# Patient Record
Sex: Female | Born: 1950 | Race: White | Hispanic: No | Marital: Married | Smoking: Never smoker
Health system: Southern US, Community
[De-identification: ages and names within clinical notes are randomized; demographics above are authoritative.]

## PROBLEM LIST (undated history)

## (undated) ENCOUNTER — Ambulatory Visit (HOSPITAL_COMMUNITY): Admission: EM | Payer: Medicare Other | Source: Home / Self Care

## (undated) DIAGNOSIS — R112 Nausea with vomiting, unspecified: Secondary | ICD-10-CM

## (undated) DIAGNOSIS — E079 Disorder of thyroid, unspecified: Secondary | ICD-10-CM

## (undated) DIAGNOSIS — R002 Palpitations: Secondary | ICD-10-CM

## (undated) DIAGNOSIS — F419 Anxiety disorder, unspecified: Secondary | ICD-10-CM

## (undated) DIAGNOSIS — Z9889 Other specified postprocedural states: Secondary | ICD-10-CM

## (undated) HISTORY — DX: Disorder of thyroid, unspecified: E07.9

## (undated) HISTORY — DX: Palpitations: R00.2

## (undated) HISTORY — PX: BACK SURGERY: SHX140

---

## 1986-10-09 HISTORY — PX: BACK SURGERY: SHX140

## 1989-10-09 HISTORY — PX: ABDOMINAL HYSTERECTOMY: SHX81

## 1998-12-17 ENCOUNTER — Emergency Department (HOSPITAL_COMMUNITY): Admission: EM | Admit: 1998-12-17 | Discharge: 1998-12-17 | Payer: Self-pay | Admitting: Emergency Medicine

## 1998-12-17 ENCOUNTER — Encounter: Payer: Self-pay | Admitting: Emergency Medicine

## 1999-07-20 ENCOUNTER — Encounter: Admission: RE | Admit: 1999-07-20 | Discharge: 1999-07-20 | Payer: Self-pay | Admitting: Family Medicine

## 2000-04-19 ENCOUNTER — Encounter: Admission: RE | Admit: 2000-04-19 | Discharge: 2000-04-19 | Payer: Self-pay | Admitting: Family Medicine

## 2000-04-19 ENCOUNTER — Encounter: Payer: Self-pay | Admitting: Family Medicine

## 2000-08-01 ENCOUNTER — Observation Stay (HOSPITAL_COMMUNITY): Admission: RE | Admit: 2000-08-01 | Discharge: 2000-08-02 | Payer: Self-pay | Admitting: Obstetrics and Gynecology

## 2000-08-01 ENCOUNTER — Encounter (INDEPENDENT_AMBULATORY_CARE_PROVIDER_SITE_OTHER): Payer: Self-pay | Admitting: Specialist

## 2001-03-20 ENCOUNTER — Encounter: Payer: Self-pay | Admitting: Family Medicine

## 2001-03-20 ENCOUNTER — Encounter: Admission: RE | Admit: 2001-03-20 | Discharge: 2001-03-20 | Payer: Self-pay | Admitting: Family Medicine

## 2001-07-03 ENCOUNTER — Ambulatory Visit (HOSPITAL_COMMUNITY): Admission: RE | Admit: 2001-07-03 | Discharge: 2001-07-03 | Payer: Self-pay | Admitting: Gastroenterology

## 2002-04-29 ENCOUNTER — Other Ambulatory Visit: Admission: RE | Admit: 2002-04-29 | Discharge: 2002-04-29 | Payer: Self-pay | Admitting: Obstetrics and Gynecology

## 2003-05-07 ENCOUNTER — Other Ambulatory Visit: Admission: RE | Admit: 2003-05-07 | Discharge: 2003-05-07 | Payer: Self-pay | Admitting: Obstetrics and Gynecology

## 2003-07-14 ENCOUNTER — Other Ambulatory Visit: Admission: RE | Admit: 2003-07-14 | Discharge: 2003-07-14 | Payer: Self-pay | Admitting: Diagnostic Radiology

## 2005-08-10 ENCOUNTER — Encounter: Admission: RE | Admit: 2005-08-10 | Discharge: 2005-08-10 | Payer: Self-pay | Admitting: Family Medicine

## 2006-08-13 ENCOUNTER — Encounter: Admission: RE | Admit: 2006-08-13 | Discharge: 2006-08-13 | Payer: Self-pay | Admitting: Family Medicine

## 2007-09-20 ENCOUNTER — Encounter: Admission: RE | Admit: 2007-09-20 | Discharge: 2007-09-20 | Payer: Self-pay | Admitting: Family Medicine

## 2007-10-11 ENCOUNTER — Encounter: Admission: RE | Admit: 2007-10-11 | Discharge: 2007-10-11 | Payer: Self-pay | Admitting: Surgery

## 2007-10-11 ENCOUNTER — Encounter: Admission: RE | Admit: 2007-10-11 | Discharge: 2007-10-11 | Payer: Self-pay | Admitting: Oral Surgery

## 2007-11-10 HISTORY — PX: NASAL SINUS SURGERY: SHX719

## 2008-09-24 ENCOUNTER — Encounter: Admission: RE | Admit: 2008-09-24 | Discharge: 2008-09-24 | Payer: Self-pay | Admitting: Family Medicine

## 2009-05-09 HISTORY — PX: COLON SURGERY: SHX602

## 2009-09-30 ENCOUNTER — Encounter: Admission: RE | Admit: 2009-09-30 | Discharge: 2009-09-30 | Payer: Self-pay | Admitting: Family Medicine

## 2010-03-22 ENCOUNTER — Other Ambulatory Visit: Admission: RE | Admit: 2010-03-22 | Discharge: 2010-03-22 | Payer: Self-pay | Admitting: Diagnostic Radiology

## 2010-03-22 ENCOUNTER — Encounter: Admission: RE | Admit: 2010-03-22 | Discharge: 2010-03-22 | Payer: Self-pay | Admitting: Surgery

## 2010-10-04 ENCOUNTER — Encounter
Admission: RE | Admit: 2010-10-04 | Discharge: 2010-10-04 | Payer: Self-pay | Source: Home / Self Care | Attending: Family Medicine | Admitting: Family Medicine

## 2010-10-29 ENCOUNTER — Encounter: Payer: Self-pay | Admitting: Family Medicine

## 2011-02-22 ENCOUNTER — Encounter (INDEPENDENT_AMBULATORY_CARE_PROVIDER_SITE_OTHER): Payer: Self-pay | Admitting: Surgery

## 2011-02-24 NOTE — Discharge Summary (Signed)
[facility]  Patient:    Julia French, Julia French                          MRN: [id number] Adm. Date:  [id number] Disc. Date: [id number] Attending:  Cordelia Pen Ii                           Discharge Summary  HISTORY OF PRESENT ILLNESS:  The patient is a 60 year old married white female, G1, P1, with enlarging leiomyomata.  HOSPITAL COURSE:  The patient is admitted to the hospital and undergoes laparoscopically assisted vaginal hysterectomy with bilateral salpingo-oophorectomy on August 01, 2000.  This is without complication. Estimated blood loss was 350 cc.  On the evening of surgery she has good pain relief.  She had some nausea and vomiting which had resolved and is not passing flatus.  She remains afebrile with stable vital signs and good urine output.  On the day of discharge she is ambulating well, voiding well, and passing flatus.  She remains afebrile.  Hemoglobin is 10.3, white count 7.2, and platelet count 263,000.  Pathology is pending.  CONDITION ON DISCHARGE:  Good.  DIET:  Regular as tolerated.  ACTIVITY:  No lifting, no operation of automobiles, no vaginal entry.  DISCHARGE INSTRUCTIONS:  She is to call the office with problems including but not limited to temperature over 100 degrees, increasing pain, heavy vaginal bleeding, or persistent nausea and vomiting.  MEDICATIONS: 1. Tylox #30 1-2 p.o. q.6h. p.r.n. 2. Premarin 0.625 mg 1 p.o. q.d.  FOLLOW-UP:  In the office in two weeks. DD:  08/02/00 TD:  08/03/00 Job: [id number]

## 2011-02-24 NOTE — Procedures (Signed)
[place]  Patient:    Julia French, Julia French Visit Number: [id number] MRN: [id number]          Service Type: END Location: ENDO Attending Physician:  Charna Elizabeth Dictated by:   Anselmo Rod, M.D. Proc. Date: 07/03/01 Admit Date:  07/03/2001   CC:         Teena Irani. Arlyce Dice, M.D.   Procedure Report  DATE OF BIRTH:  December 29, 1951  REFERRING PHYSICIAN:  Teena Irani. Arlyce Dice, M.D.  PROCEDURE PERFORMED:  Screening colonoscopy.  ENDOSCOPIST:  Anselmo Rod, M.D.  INSTRUMENT USED:  Olympus pediatric video colonoscope.  INDICATIONS FOR PROCEDURE:  Family history of colon cancer in a 60 year old female.  Rule out colonic polyps, masses, hemorrhoids, etc.  PREPROCEDURE PREPARATION:  Informed consent was procured from the patient. The patient was fasted for eight hours prior to the procedure and prepped with a bottle of magnesium citrate and a gallon of NuLytely the night prior to the procedure.  PREPROCEDURE PHYSICAL:  The patient had stable vital signs.  Neck supple. Chest clear to auscultation.  S1, S2 regular.  Abdomen soft with normal abdominal bowel sounds.  DESCRIPTION OF PROCEDURE:  The patient was placed in the left lateral decubitus position and sedated with 50 mg of Demerol and 7 mg of Versed intravenously.  Once the patient was adequately sedated and maintained on low-flow oxygen and continuous cardiac monitoring, the Olympus video colonoscope was advanced from the rectum to the cecum.  Except for a few scattered diverticula, no masses, polyps, erosions, etc. were seen.  IMPRESSION:  Healthy-appearing colon up to the cecum except for a few scattered diverticula.  RECOMMENDATIONS: 1. Repeat colorectal screening is recommended in the next five years unless    the patient were to develop any abnormal symptoms in the interim. 2. A high fiber diet has been discussed and brochures have been given    to her for her education. 3. Outpatient  follow-up is advised on a p.r.n. basis.  Dictated by:   Anselmo Rod, M.D. Attending Physician:  Charna Elizabeth DD:  07/03/01 TD:  07/03/01 Job: [id number]

## 2011-02-24 NOTE — Op Note (Signed)
[facility]  Patient:    Julia French, Julia French                          MRN: [id number] Proc. Date: 08/01/00 Adm. Date:  [id number] Attending:  Cordelia Pen Ii                           Operative Report  PREOPERATIVE DIAGNOSIS:  Uterine leiomyomata.  POSTOPERATIVE DIAGNOSIS:  Uterine leiomyomata.  PROCEDURE:  Laparoscopically assisted vaginal hysterectomy with bilateral salpingo-oophorectomy.  SURGEON:  Guy Sandifer. Arleta Creek, M.D.  ASSISTANT:  Trevor Iha, M.D.  ANESTHESIA:  General with endotracheal intubation.  ESTIMATED BLOOD LOSS:  350 cc.  INDICATIONS AND CONSENT:  This patient is a 60 year old married white female, G1, P1, with enlarging leiomyomata.  Details are dictated in the history and physical.  Laparoscopically assisted vaginal hysterectomy with bilateral salpingo-oophorectomy is discussed with the patient and her husband.  The possible risks andcomplications have been discussed, including but not limited to infection, bowel, bladder, and ureteral damage, bleeding requiring transfusion ofblood products with possible transfusion reaction and HIV or hepatitis acquisition, DVT or PE, pneumonia, laparotomy, and fistula formation.  Allquestions are answered, and consent is signed on the chart.  FINDINGS:  Upper abdomen is normal.  There are some filmy adhesions to the anterior abdominal wall to right pelvic brim, which do not form closed loops. The uterus is enlarged to approximately an eight to ten weeks size with intramural leiomyomata.  These are primarily found on the posterior fundus. Tubes and ovaries normal bilaterally.  There are some filmy adhesions around the left infundibulopelvic ligament.  DESCRIPTION OF PROCEDURE:  The patient was taken to the operating room and placed in the dorsal supine position, where general anesthesia is induced via endotracheal intubation.  She is then placed in the dorsal lithotomy  position, where she is prepped abdominally and vaginally, bladder is straight-catheterized, Hulka tenaculum is placed in the uterus as a manipulator, and she is drapedin the sterile fashion.  A small infraumbilical incision is made, and a 12 mm disposable trocar sleeve was placed without difficulty.  Placement is verified with the laparoscope, and no damage to surrounding structuresis noted.  Pneumoperitoneum is induced.  Small suprapubic and later leftlower quadrant incisions are made after careful transillumination, and 5mm nondisposable trocar sleeves are placed under direct visualization without difficulty.  After noting the above findings, the adhesions around the left infundibulopelvic ligament are taken down sharply without difficulty.  Minor bipolar cautery is used to obtain hemostasis.  The course ofthe ureters is identified bilaterally.  Then using the 5 mm laparoscope through the left lower trocar sleeve, the infundibulopelvic ligament followed by the round ligaments are taken down with the disposable stapler with the white cartridge bilaterally.  This uses a total of three bites on the right and two bites on the left.  Reinspection with the operative laparoscope reveals good hemostasis of the staple line.  There is some bleeding in the mesosalpinx on the left tube where it had torn during traction with the grasper.  Bipolar cautery is used to slow this down on the left fallopian tube.  Instruments are removed, pneumoperitoneum is reduced, and attention is turned to the vagina.  Weighted speculum is placed, and the posterior cul-de-sac is entered sharply with the scissors without difficulty. The cervix is circumscribed with a scalpel.  The vaginal introitus is fairly narrow, greatly reducing  mobility.  In addition, there is essentially little to no prolapse of the uterus still at this point.  The uterosacral ligaments are taken down bilaterally and ligated with transfixion  sutures of 0 Monocryl. All suture will be 0 Monocryl unless otherwise designated.  Progressive bites of the bladder pillars and the cardinal ligaments are then taken bilaterally. Dissection anteriorly is carried out sharply and bluntly anteriorly.  The anterior cul-de-sac is then entered without difficulty.  After ligating the uterine vessels and taking two bites above this level bilaterally, the fundus is delivered posteriorly with the adnexa.  Proximal ligaments are clamped and cut, and the specimen is delivered.  The left ligament is ligated first with a free tie, then with asuture.  The right pedicle is ligated with a free tie. Good hemostasis is noted.  The uterosacral ligaments are then plicated to the vagina bilaterally.  The uterosacral ligaments are then plicated in the midline withone suture.  The cuff is closed posteriorly, then anteriorly, with figure-of-eight sutures.  A Foley catheter is placed, and clear urine is noted.  Reinspection with the laparoscope reveals minor bleeding at peritoneal edges on the vaginal cuff.  These are controlled with bipolar cautery. Irrigation is carried out, and excess fluid is removed.  The inferior trocar sleeves are removed, pneumoperitoneum is reduced, and no bleeding is noted from any site.  The umbilical trocar sleeve is removed.  The umbilical incision is closed first with a 0 Vicryl in the deeper underlying tissues with care being taken not to pick up any underlying structures.  The skinincision is then closed with subcuticular 3-0 Vicryl suture.  The skin is injected with 0.5% plain Marcaine.  Dressings are applied.  All counts are correct.  The patient is awakened and taken to the recovery room in stable condition. DD:  08/01/00 TD:  08/01/00 Job: [id number]

## 2011-02-24 NOTE — H&P (Signed)
[facility]  Patient:    Julia French, Julia French                          MRN: [id number] Adm. Date:  08/01/00 Attending:  Guy Sandifer. Arleta Creek, M.D.                         History and Physical  CHIEF COMPLAINT:  Uterine leiomyomata.  HISTORY OF PRESENT ILLNESS:  The patient is a 60 year old married white female gravida 1, para 1, who has been on Premphase for several months.  On a recent examination, she was felt to have an enlarged uterus which was a new finding. She was then referred to me.  On examination, the uterus was 8-10 weeks in size and irregular in contour.  Pelvic ultrasound on April 19, 2000, revealed multiple uterine leiomyomata with largest being 3.5 cm.  The right ovary appeared normal and the left ovary could not be visualized.  The patient complains of abdominal pain and bloating and pelvic pressure for the past 2-3 months.  She also has bumper dyspareunia.  After a careful discussion of options of management, the patient is being admitted for a laparoscopically assisted vaginal hysterectomy with bilateral salpingo-oophorectomy.  PAST MEDICAL HISTORY: 1. Hypothyroidism. 2. History of a bulging lumbar disk in 1985.  PAST SURGICAL HISTORY:  None.  OBSTETRIC HISTORY:  Vaginal delivery in 1982.  MEDICATIONS:  Synthroid 0.1 mg daily, Premphase which was discontinued on July 18, 2000, vitamin E, and vitamin C daily.  ALLERGIES:  NO KNOWN DRUG ALLERGIES.  FAMILY HISTORY:  Positive for twins in mother, type 2 diabetes in mother, and stomach cancer in brother.  SOCIAL HISTORY:  The patient denies tobacco, alcohol, or drug abuse.  REVIEW OF SYSTEMS:  Negative except as above.  PHYSICAL EXAMINATION:  VITAL SIGNS:  Height 5 feet and 2 inches, weight 134.5 pounds, and blood pressure 114/64.  HEENT:  Without thyromegaly.  LUNGS:  Clear to auscultation.  HEART:  Regular rate and rhythm.  ABDOMEN:  Soft and nontender without  masses.  BREASTS:  Not examined.  PELVIC EXAMINATION:  Both vagina and cervix without lesion.  Uterus is irregular is shape, 8-10 weeks in size, mobile, and nontender.  Adnexae are nontender without masses.  Rectovaginal exam without masses.  EXTREMITIES:  Grossly within normal limits.  NEUROLOGICAL EXAMINATION:  Grossly within normal limits.  LABORATORY DATA:  Pap smear is benign 3-4 months ago.  ASSESSMENT:  Uterine leiomyomata.  PLAN:  Laparoscopically assisted vaginal hysterectomy with bilateral salpingo-oophorectomy. DD:  07/19/00 TD:  07/19/00 Job: [id number]

## 2012-01-25 ENCOUNTER — Other Ambulatory Visit (INDEPENDENT_AMBULATORY_CARE_PROVIDER_SITE_OTHER): Payer: Self-pay | Admitting: Surgery

## 2012-01-25 ENCOUNTER — Telehealth (INDEPENDENT_AMBULATORY_CARE_PROVIDER_SITE_OTHER): Payer: Self-pay

## 2012-01-25 DIAGNOSIS — E041 Nontoxic single thyroid nodule: Secondary | ICD-10-CM

## 2012-01-25 NOTE — Telephone Encounter (Signed)
Thyroid ultrasound at [facility]- [address] location Ph [phone].  Information and lab slip mailed on 01/25/2012.

## 2012-01-30 ENCOUNTER — Other Ambulatory Visit: Payer: Self-pay

## 2012-01-30 ENCOUNTER — Other Ambulatory Visit (INDEPENDENT_AMBULATORY_CARE_PROVIDER_SITE_OTHER): Payer: Self-pay | Admitting: Surgery

## 2012-01-30 ENCOUNTER — Ambulatory Visit
Admission: RE | Admit: 2012-01-30 | Discharge: 2012-01-30 | Disposition: A | Discharge status: Home / Self Care | Payer: BC Managed Care – PPO | Source: Ambulatory Visit | Attending: Surgery | Admitting: Surgery

## 2012-01-30 DIAGNOSIS — E041 Nontoxic single thyroid nodule: Secondary | ICD-10-CM

## 2012-02-05 ENCOUNTER — Telehealth (INDEPENDENT_AMBULATORY_CARE_PROVIDER_SITE_OTHER): Payer: Self-pay | Admitting: General Surgery

## 2012-02-05 NOTE — Telephone Encounter (Signed)
Pt of Dr. Gerrit Friends had TSH done recently, calling for refill of Synthroid (Levothyroxine.)  Currently takes 88 mcg QD.  Please call to [facility] at [phone].

## 2012-02-05 NOTE — Telephone Encounter (Signed)
error 

## 2012-02-05 NOTE — Telephone Encounter (Signed)
Per VO Dr. Carolynne Edouard, called in refill to [facility]:   Levothyroxine 88 mcg; #30, 1 po QD, refill x2 (or may dispense #90 with 0 refill.)  Message left for pt to pick-up.

## 2012-02-28 ENCOUNTER — Telehealth (INDEPENDENT_AMBULATORY_CARE_PROVIDER_SITE_OTHER): Payer: Self-pay | Admitting: Surgery

## 2012-02-28 ENCOUNTER — Ambulatory Visit (INDEPENDENT_AMBULATORY_CARE_PROVIDER_SITE_OTHER): Payer: BC Managed Care – PPO | Admitting: Surgery

## 2012-02-28 ENCOUNTER — Encounter (HOSPITAL_COMMUNITY): Payer: Self-pay | Admitting: *Deleted

## 2012-02-28 ENCOUNTER — Encounter (INDEPENDENT_AMBULATORY_CARE_PROVIDER_SITE_OTHER): Payer: Self-pay | Admitting: Surgery

## 2012-02-28 VITALS — BP 134/86 | HR 74 | Temp 98.2°F | Resp 14 | Ht 63.0 in | Wt 150.1 lb

## 2012-02-28 DIAGNOSIS — E042 Nontoxic multinodular goiter: Secondary | ICD-10-CM | POA: Insufficient documentation

## 2012-02-28 NOTE — Progress Notes (Signed)
Chief Complaint  Patient presents with  . Thyroid Nodule    Pain, lump on side of neck - thyroid recheck    HISTORY: Patient is a 60-year-old female followed in my surgical practice for multinodular thyroid goiter. Patient has a known dominant mass in the left thyroid lobe. She underwent a followup ultrasound scanning on January 30, 2012. This showed interval enlargement of the dominant nodule in the left lobe which now measures 5.8 cm. Previous fine-needle aspiration biopsy had shown benign findings. However there are some calcifications present. Patient has noted some pain in the left side of the neck on occasion. This lasts for a few minutes. She denies any dysphagia. She denies any dyspnea. Recent laboratory studies include a TSH level which was normal at 1.369. Patient continues on Synthroid 100 mcg daily.  Past Medical History  Diagnosis Date  . Thyroid disease   . Palpitations      Current Outpatient Prescriptions  Medication Sig Dispense Refill  . levothyroxine (SYNTHROID, LEVOTHROID) 100 MCG tablet Take 100 mcg by mouth daily.        . omega-3 acid ethyl esters (LOVAZA) 1 G capsule Take 2 g by mouth daily. 4 capsules daily          No Known Allergies   Family History  Problem Relation Age of Onset  . Cancer Brother     Stomach     History   Social History  . Marital Status: Married    Spouse Name: N/A    Number of Children: N/A  . Years of Education: N/A   Social History Main Topics  . Smoking status: Never Smoker   . Smokeless tobacco: None  . Alcohol Use: Yes     1 glass of red wine 3 times a week  . Drug Use: None  . Sexually Active: None   Other Topics Concern  . None   Social History Narrative  . None     REVIEW OF SYSTEMS - PERTINENT POSITIVES ONLY: Intermittent left neck pain, denies dysphagia, denies dyspnea, denies tremor, denies palpitations  EXAM: Filed Vitals:   02/28/12 1022  BP: 134/86  Pulse: 74  Temp: 98.2 F (36.8 C)  Resp: 14     HEENT: normocephalic; pupils equal and reactive; sclerae clear; dentition good; mucous membranes moist NECK:  Dominant mass occupying the entire left thyroid lobe and extending beneath the clavicle, firm, mobile with swallowing, nontender; right lobe without palpable mass or nodule; asymmetric on extension; no palpable anterior or posterior cervical lymphadenopathy; no supraclavicular masses; no tenderness CHEST: clear to auscultation bilaterally without rales, rhonchi, or wheezes CARDIAC: regular rate and rhythm without significant murmur; peripheral pulses are full EXT:  non-tender without edema; no deformity NEURO: no gross focal deficits; no sign of tremor   LABORATORY RESULTS: See [facility] ([facility]) for most recent results   RADIOLOGY RESULTS: See [facility] (CHL-Epic) for most recent results   IMPRESSION: #1 dominant left thyroid nodule, interval enlargement, 5.8 cm, with mild compressive symptoms #2 multinodular thyroid goiter #3 hypothyroidism  PLAN: I discussed the above issues and findings at length with the patient and her husband. I have recommended total thyroidectomy for management of an enlarging left thyroid mass. I believe the risk of malignancy is approximately 10-20%. I believe she is beginning to develop compressive symptoms. We discussed the procedure, the hospital stay to be anticipated, potential complications, and the need for lifelong thyroid hormone replacement. They understand and wish to proceed.  The risks and benefits   of the procedure have been discussed at length with the patient.  The patient understands the proposed procedure, potential alternative treatments, and the course of recovery to be expected.  All of the patient's questions have been answered at this time.  The patient wishes to proceed with surgery.  Yarnell Arvidson M. Liller Yohn, MD, FACS General & Endocrine Surgery [facility]   Visit Diagnoses: 1. Multinodular  goiter (nontoxic)     Primary Care Physician: No primary provider on file.   

## 2012-02-28 NOTE — Patient Instructions (Signed)
[facility] [phone]  THYROID & PARATHYROID SURGERY -- POST OP INSTRUCTIONS  Always review your discharge instruction sheet from the facility where your surgery was performed.  1. A prescription for pain medication may be given to you upon discharge.  Take your pain medication as prescribed, if needed.  If narcotic pain medicine is not needed, then you may take acetaminophen (Tylenol) or ibuprofen (Advil) as needed. 2. Take your usually prescribed medications unless otherwise directed. 3. If you need a refill on your pain medication, please contact your pharmacy. They will contact our office to request authorization.  Prescriptions will not be processed after 5 pm or on weekends. 4. Start with a light diet upon arrival home, such as soup and crackers, etc.  Be sure to drink pleny of fluids daily.  Resume your normal diet the day after surgery. 5. Most patients will experience some swelling and bruising on the chest and neck area.  Ice packs will help.  Swelling and bruising can take several days to resolve.  6. It is common to experience some constipation if taking pain medication after surgery.  Increasing fluid intake and taking a stool softener will usually help or prevent this problem.  A mild laxative (Milk of Magnesia or Miralax) should be taken according to package directions if there are no bowel movements after 48 hours. 7. You may remove your bandages 24-48 hours after surgery, and you may shower at that time.  You have steri-strips (small skin tapes) in place directly over the incision.  These strips should be left on the skin for 7-10 days and then removed. 8. You may resume regular (light) daily activities beginning the next day-such as daily self-care, walking, climbing stairs-gradually increasing activities as tolerated.  You may have sexual intercourse when it is comfortable.  Refrain from any heavy lifting or straining until approved by your doctor.  You may drive  when you no longer are taking prescription pain medication, you can comfortably wear a seatbelt, and you can safely maneuver your car and apply brakes. 9. You should see your doctor in the office for a follow-up appointment approximately two weeks after your surgery.  Make sure that you call for this appointment within a day or two after you arrive home to insure a convenient appointment time.  WHEN TO CALL YOUR DOCTOR: 1. Fever over 101.5 2. Inability to urinate 3. Nausea and/or vomiting - persistent 4. Extreme swelling or bruising 5. Continued bleeding from incision 6. Increased pain, redness, or drainage from the incision 7. Difficulty swallowing or breathing 8. Muscle cramping or spasms 9. Numbness or tingling in hands or feet or around lips  The clinic staff is available to answer your questions during regular business hours.  Please don't hesitate to call and ask to speak to one of the nurses if you have concerns.  [email or web address]   

## 2012-02-29 ENCOUNTER — Other Ambulatory Visit (INDEPENDENT_AMBULATORY_CARE_PROVIDER_SITE_OTHER): Payer: Self-pay

## 2012-02-29 ENCOUNTER — Telehealth (INDEPENDENT_AMBULATORY_CARE_PROVIDER_SITE_OTHER): Payer: Self-pay

## 2012-02-29 NOTE — Telephone Encounter (Signed)
PO appt card and lab slip for PO calcium mailed to pt.

## 2012-03-05 ENCOUNTER — Ambulatory Visit (HOSPITAL_COMMUNITY): Payer: BC Managed Care – PPO

## 2012-03-05 ENCOUNTER — Encounter (HOSPITAL_COMMUNITY)
Admission: RE | Disposition: A | Discharge status: Home / Self Care | Payer: Self-pay | Source: Ambulatory Visit | Attending: Surgery

## 2012-03-05 ENCOUNTER — Encounter (HOSPITAL_COMMUNITY): Payer: Self-pay | Admitting: *Deleted

## 2012-03-05 ENCOUNTER — Ambulatory Visit (HOSPITAL_COMMUNITY)
Admission: RE | Admit: 2012-03-05 | Discharge: 2012-03-07 | Disposition: A | Discharge status: Home / Self Care | Payer: BC Managed Care – PPO | Source: Ambulatory Visit | Attending: Surgery | Admitting: Surgery

## 2012-03-05 ENCOUNTER — Encounter (HOSPITAL_COMMUNITY): Payer: Self-pay

## 2012-03-05 ENCOUNTER — Ambulatory Visit (HOSPITAL_COMMUNITY): Payer: BC Managed Care – PPO | Admitting: *Deleted

## 2012-03-05 DIAGNOSIS — R42 Dizziness and giddiness: Secondary | ICD-10-CM | POA: Diagnosis not present

## 2012-03-05 DIAGNOSIS — Z01810 Encounter for preprocedural cardiovascular examination: Secondary | ICD-10-CM | POA: Insufficient documentation

## 2012-03-05 DIAGNOSIS — I517 Cardiomegaly: Secondary | ICD-10-CM | POA: Diagnosis not present

## 2012-03-05 DIAGNOSIS — E042 Nontoxic multinodular goiter: Secondary | ICD-10-CM

## 2012-03-05 DIAGNOSIS — R1110 Vomiting, unspecified: Secondary | ICD-10-CM | POA: Insufficient documentation

## 2012-03-05 DIAGNOSIS — R002 Palpitations: Secondary | ICD-10-CM | POA: Insufficient documentation

## 2012-03-05 DIAGNOSIS — Z79899 Other long term (current) drug therapy: Secondary | ICD-10-CM | POA: Diagnosis not present

## 2012-03-05 DIAGNOSIS — M542 Cervicalgia: Secondary | ICD-10-CM | POA: Diagnosis not present

## 2012-03-05 DIAGNOSIS — E063 Autoimmune thyroiditis: Secondary | ICD-10-CM

## 2012-03-05 DIAGNOSIS — Z01812 Encounter for preprocedural laboratory examination: Secondary | ICD-10-CM | POA: Insufficient documentation

## 2012-03-05 HISTORY — DX: Anxiety disorder, unspecified: F41.9

## 2012-03-05 HISTORY — PX: THYROIDECTOMY: SHX17

## 2012-03-05 LAB — CBC
HCT: 39.6 % (ref 36.0–46.0)
Platelets: 296 10*3/uL (ref 150–400)
RDW: 12.7 % (ref 11.5–15.5)
WBC: 4.5 10*3/uL (ref 4.0–10.5)

## 2012-03-05 SURGERY — THYROIDECTOMY
Anesthesia: General | Site: Neck | Wound class: Clean

## 2012-03-05 MED ORDER — ACETAMINOPHEN 325 MG PO TABS: 650.0000 mg | ORAL_TABLET | ORAL | Status: DC | PRN

## 2012-03-05 MED ORDER — KETAMINE HCL 10 MG/ML IJ SOLN
INTRAMUSCULAR | Status: DC | PRN
  Administered 2012-03-05 (×2): 10 mg via INTRAVENOUS
  Administered 2012-03-05: 20 mg via INTRAVENOUS

## 2012-03-05 MED ORDER — GLYCOPYRROLATE 0.2 MG/ML IJ SOLN
INTRAMUSCULAR | Status: DC | PRN
  Administered 2012-03-05: 0.2 mg via INTRAVENOUS

## 2012-03-05 MED ORDER — KCL IN DEXTROSE-NACL 20-5-0.45 MEQ/L-%-% IV SOLN
INTRAVENOUS | Status: DC
  Administered 2012-03-05 – 2012-03-07 (×5): via INTRAVENOUS
  Filled 2012-03-05 (×5): qty 1000

## 2012-03-05 MED ORDER — FENTANYL CITRATE 0.05 MG/ML IJ SOLN
INTRAMUSCULAR | Status: AC
  Filled 2012-03-05: qty 2

## 2012-03-05 MED ORDER — CALCIUM CARBONATE 1250 (500 CA) MG PO TABS
1250.0000 mg | ORAL_TABLET | Freq: Three times a day (TID) | ORAL | Status: DC
  Administered 2012-03-05 – 2012-03-07 (×6): 1250 mg via ORAL
  Filled 2012-03-05 (×8): qty 1

## 2012-03-05 MED ORDER — NEOSTIGMINE METHYLSULFATE 1 MG/ML IJ SOLN
INTRAMUSCULAR | Status: DC | PRN
  Administered 2012-03-05: 2 mg via INTRAVENOUS

## 2012-03-05 MED ORDER — LACTATED RINGERS IV SOLN: INTRAVENOUS | Status: DC

## 2012-03-05 MED ORDER — METOCLOPRAMIDE HCL 5 MG/ML IJ SOLN
INTRAMUSCULAR | Status: DC | PRN
  Administered 2012-03-05: 10 mg via INTRAVENOUS

## 2012-03-05 MED ORDER — ACETAMINOPHEN 10 MG/ML IV SOLN
INTRAVENOUS | Status: DC | PRN
  Administered 2012-03-05: 1000 mg via INTRAVENOUS

## 2012-03-05 MED ORDER — CEFAZOLIN SODIUM 1-5 GM-% IV SOLN
1.0000 g | INTRAVENOUS | Status: AC
  Administered 2012-03-05: 1 g via INTRAVENOUS

## 2012-03-05 MED ORDER — DEXAMETHASONE SODIUM PHOSPHATE 4 MG/ML IJ SOLN
INTRAMUSCULAR | Status: DC | PRN
  Administered 2012-03-05: 10 mg via INTRAVENOUS

## 2012-03-05 MED ORDER — ACETAMINOPHEN 10 MG/ML IV SOLN
INTRAVENOUS | Status: AC
  Filled 2012-03-05: qty 100

## 2012-03-05 MED ORDER — PHENYLEPHRINE HCL 10 MG/ML IJ SOLN
INTRAMUSCULAR | Status: DC | PRN
  Administered 2012-03-05 (×5): 80 ug via INTRAVENOUS

## 2012-03-05 MED ORDER — LACTATED RINGERS IV SOLN
INTRAVENOUS | Status: DC | PRN
  Administered 2012-03-05: 09:00:00 via INTRAVENOUS

## 2012-03-05 MED ORDER — LIDOCAINE HCL (CARDIAC) 20 MG/ML IV SOLN
INTRAVENOUS | Status: DC | PRN
  Administered 2012-03-05: 50 mg via INTRAVENOUS

## 2012-03-05 MED ORDER — HYDROMORPHONE HCL PF 1 MG/ML IJ SOLN
1.0000 mg | INTRAMUSCULAR | Status: DC | PRN
  Administered 2012-03-05: 1 mg via INTRAVENOUS
  Filled 2012-03-05: qty 1

## 2012-03-05 MED ORDER — MUPIROCIN 2 % EX OINT
1.0000 "application " | TOPICAL_OINTMENT | Freq: Two times a day (BID) | CUTANEOUS | Status: DC
  Administered 2012-03-05 – 2012-03-07 (×4): 1 via NASAL

## 2012-03-05 MED ORDER — HYDROMORPHONE HCL PF 1 MG/ML IJ SOLN
INTRAMUSCULAR | Status: DC | PRN
  Administered 2012-03-05: 1 mg via INTRAVENOUS

## 2012-03-05 MED ORDER — FENTANYL CITRATE 0.05 MG/ML IJ SOLN
25.0000 ug | INTRAMUSCULAR | Status: DC | PRN
  Administered 2012-03-05 (×3): 50 ug via INTRAVENOUS

## 2012-03-05 MED ORDER — MIDAZOLAM HCL 5 MG/5ML IJ SOLN
INTRAMUSCULAR | Status: DC | PRN
  Administered 2012-03-05: 2 mg via INTRAVENOUS

## 2012-03-05 MED ORDER — ROCURONIUM BROMIDE 100 MG/10ML IV SOLN
INTRAVENOUS | Status: DC | PRN
  Administered 2012-03-05: 40 mg via INTRAVENOUS

## 2012-03-05 MED ORDER — 0.9 % SODIUM CHLORIDE (POUR BTL) OPTIME
TOPICAL | Status: DC | PRN
  Administered 2012-03-05: 1000 mL

## 2012-03-05 MED ORDER — MUPIROCIN 2 % EX OINT
TOPICAL_OINTMENT | CUTANEOUS | Status: AC
  Filled 2012-03-05: qty 22

## 2012-03-05 MED ORDER — CEFAZOLIN SODIUM 1-5 GM-% IV SOLN
INTRAVENOUS | Status: AC
Start: 2012-03-05 — End: 2012-03-05
  Filled 2012-03-05: qty 50

## 2012-03-05 MED ORDER — PROPOFOL 10 MG/ML IV EMUL
INTRAVENOUS | Status: DC | PRN
  Administered 2012-03-05: 120 mg via INTRAVENOUS

## 2012-03-05 MED ORDER — PROMETHAZINE HCL 25 MG/ML IJ SOLN: 6.2500 mg | INTRAMUSCULAR | Status: DC | PRN

## 2012-03-05 MED ORDER — HETASTARCH-ELECTROLYTES 6 % IV SOLN
INTRAVENOUS | Status: DC | PRN
  Administered 2012-03-05: 10:00:00 via INTRAVENOUS

## 2012-03-05 MED ORDER — MUPIROCIN 2 % EX OINT: 1.0000 "application " | TOPICAL_OINTMENT | Freq: Two times a day (BID) | CUTANEOUS | Status: DC

## 2012-03-05 MED ORDER — FENTANYL CITRATE 0.05 MG/ML IJ SOLN
INTRAMUSCULAR | Status: DC | PRN
  Administered 2012-03-05: 100 ug via INTRAVENOUS

## 2012-03-05 MED ORDER — PROMETHAZINE HCL 25 MG/ML IJ SOLN
12.5000 mg | INTRAMUSCULAR | Status: DC | PRN
  Administered 2012-03-05: 12.5 mg via INTRAVENOUS
  Filled 2012-03-05 (×2): qty 1

## 2012-03-05 MED ORDER — HYDROCODONE-ACETAMINOPHEN 5-325 MG PO TABS: 1.0000 | ORAL_TABLET | ORAL | Status: DC | PRN

## 2012-03-05 MED ORDER — CHLORHEXIDINE GLUCONATE CLOTH 2 % EX PADS
6.0000 | MEDICATED_PAD | Freq: Every day | CUTANEOUS | Status: DC
  Administered 2012-03-05 – 2012-03-07 (×3): 6 via TOPICAL

## 2012-03-05 MED ORDER — ONDANSETRON HCL 4 MG/2ML IJ SOLN
INTRAMUSCULAR | Status: DC | PRN
  Administered 2012-03-05: 4 mg via INTRAVENOUS

## 2012-03-05 SURGICAL SUPPLY — 39 items
APL SKNCLS STERI-STRIP NONHPOA (GAUZE/BANDAGES/DRESSINGS) ×1
ATTRACTOMAT 16X20 MAGNETIC DRP (DRAPES) ×2 IMPLANT
BENZOIN TINCTURE PRP APPL 2/3 (GAUZE/BANDAGES/DRESSINGS) ×2 IMPLANT
BLADE HEX COATED 2.75 (ELECTRODE) ×2 IMPLANT
BLADE SURG 15 STRL LF DISP TIS (BLADE) ×1 IMPLANT
BLADE SURG 15 STRL SS (BLADE) ×2
CANISTER SUCTION 2500CC (MISCELLANEOUS) ×2 IMPLANT
CHLORAPREP W/TINT 10.5 ML (MISCELLANEOUS) ×2 IMPLANT
CLIP TI MEDIUM 6 (CLIP) ×5 IMPLANT
CLIP TI WIDE RED SMALL 6 (CLIP) ×4 IMPLANT
CLOTH BEACON ORANGE TIMEOUT ST (SAFETY) ×2 IMPLANT
DISSECTOR ROUND CHERRY 3/8 STR (MISCELLANEOUS) IMPLANT
DRAPE PED LAPAROTOMY (DRAPES) ×2 IMPLANT
DRESSING SURGICEL FIBRLLR 1X2 (HEMOSTASIS) ×1 IMPLANT
DRSG SURGICEL FIBRILLAR 1X2 (HEMOSTASIS) ×2
ELECT REM PT RETURN 9FT ADLT (ELECTROSURGICAL) ×2
ELECTRODE REM PT RTRN 9FT ADLT (ELECTROSURGICAL) ×1 IMPLANT
GAUZE SPONGE 4X4 16PLY XRAY LF (GAUZE/BANDAGES/DRESSINGS) ×2 IMPLANT
GLOVE SURG ORTHO 8.0 STRL STRW (GLOVE) ×2 IMPLANT
GOWN STRL NON-REIN LRG LVL3 (GOWN DISPOSABLE) ×2 IMPLANT
GOWN STRL REIN XL XLG (GOWN DISPOSABLE) ×4 IMPLANT
KIT BASIN OR (CUSTOM PROCEDURE TRAY) ×2 IMPLANT
NS IRRIG 1000ML POUR BTL (IV SOLUTION) ×2 IMPLANT
PACK BASIC VI WITH GOWN DISP (CUSTOM PROCEDURE TRAY) ×2 IMPLANT
PENCIL BUTTON HOLSTER BLD 10FT (ELECTRODE) ×2 IMPLANT
SHEARS HARMONIC 9CM CVD (BLADE) ×2 IMPLANT
SPONGE GAUZE 4X4 12PLY (GAUZE/BANDAGES/DRESSINGS) ×1 IMPLANT
STAPLER VISISTAT 35W (STAPLE) ×2 IMPLANT
STRIP CLOSURE SKIN 1/2X4 (GAUZE/BANDAGES/DRESSINGS) ×2 IMPLANT
SUT MNCRL AB 4-0 PS2 18 (SUTURE) ×2 IMPLANT
SUT SILK 2 0 (SUTURE) ×2
SUT SILK 2-0 18XBRD TIE 12 (SUTURE) ×1 IMPLANT
SUT SILK 3 0 (SUTURE)
SUT SILK 3-0 18XBRD TIE 12 (SUTURE) IMPLANT
SUT VIC AB 3-0 SH 18 (SUTURE) ×2 IMPLANT
SYR BULB IRRIGATION 50ML (SYRINGE) ×2 IMPLANT
TAPE CLOTH SURG 4X10 WHT LF (GAUZE/BANDAGES/DRESSINGS) ×1 IMPLANT
TOWEL OR 17X26 10 PK STRL BLUE (TOWEL DISPOSABLE) ×2 IMPLANT
YANKAUER SUCT BULB TIP 10FT TU (MISCELLANEOUS) ×2 IMPLANT

## 2012-03-05 NOTE — Transfer of Care (Signed)
Immediate Anesthesia Transfer of Care Note  Patient: Julia French  Procedure(s) Performed: Procedure(s) (LRB): THYROIDECTOMY (N/A)  Patient Location: PACU  Anesthesia Type: General  Level of Consciousness: awake, alert , oriented, patient cooperative and responds to stimulation  Airway & Oxygen Therapy: Patient Spontanous Breathing and Patient connected to face mask oxygen  Post-op Assessment: Report given to PACU RN, Post -op Vital signs reviewed and stable and Patient moving all extremities  Post vital signs: Reviewed and stable  Complications: No apparent anesthesia complications

## 2012-03-05 NOTE — Preoperative (Signed)
Beta Blockers   Reason not to administer Beta Blockers:Not Applicable, not on home bb 

## 2012-03-05 NOTE — Progress Notes (Signed)
Upon returning to floor i was informed patient had one episode of clear emesis. Patient states she has no more nausea and her pain is well controlled. Told to call for further n/v.

## 2012-03-05 NOTE — Anesthesia Preprocedure Evaluation (Signed)
Anesthesia Evaluation  Patient identified by MRN, date of birth, ID band Patient awake    Reviewed: Allergy & Precautions, H&P , NPO status , Patient's Chart, lab work & pertinent test results  History of Anesthesia Complications Negative for: history of anesthetic complications  Airway Mallampati: II TM Distance: >3 FB Neck ROM: Full    Dental  (+) Teeth Intact and Dental Advisory Given   Pulmonary neg pulmonary ROS,  breath sounds clear to auscultation  Pulmonary exam normal       Cardiovascular negative cardio ROS  Rhythm:Regular Rate:Normal     Neuro/Psych Anxiety negative neurological ROS     GI/Hepatic negative GI ROS, Neg liver ROS,   Endo/Other  Hypothyroidism   Renal/GU negative Renal ROS  negative genitourinary   Musculoskeletal negative musculoskeletal ROS (+)   Abdominal   Peds  Hematology negative hematology ROS (+)   Anesthesia Other Findings   Reproductive/Obstetrics negative OB ROS                           Anesthesia Physical Anesthesia Plan  ASA: II  Anesthesia Plan: General   Post-op Pain Management:    Induction: Intravenous  Airway Management Planned: Oral ETT  Additional Equipment:   Intra-op Plan:   Post-operative Plan: Extubation in OR  Informed Consent: I have reviewed the patients History and Physical, chart, labs and discussed the procedure including the risks, benefits and alternatives for the proposed anesthesia with the patient or authorized representative who has indicated his/her understanding and acceptance.   Dental advisory given  Plan Discussed with: CRNA  Anesthesia Plan Comments:         Anesthesia Quick Evaluation

## 2012-03-05 NOTE — Brief Op Note (Signed)
03/05/2012  10:42 AM  PATIENT:  Julia French  61 y.o. female  PRE-OPERATIVE DIAGNOSIS:  Multinodular thyroid goiter, dominant left lobe nodule (5.8cm)  POST-OPERATIVE DIAGNOSIS:  same  PROCEDURE:  Total Thyroidectomy  SURGEON:  Surgeon(s) and Role:    * Velora Heckler, MD - Primary  ANESTHESIA:   general  EBL:  Total I/O In: 1400 [I.V.:900; IV Piggyback:500] Out: -   BLOOD ADMINISTERED:none  DRAINS: none   LOCAL MEDICATIONS USED:  NONE  SPECIMEN:  Excision  DISPOSITION OF SPECIMEN:  PATHOLOGY  COUNTS:  YES  TOURNIQUET:  * No tourniquets in log *  DICTATION: .Other Dictation: Dictation Number [id number]  PLAN OF CARE: Admit for overnight observation  PATIENT DISPOSITION:  PACU - hemodynamically stable.   Delay start of Pharmacological VTE agent (>24hrs) due to surgical blood loss or risk of bleeding: yes  Velora Heckler, MD, FACS General & Endocrine Surgery [facility] Office: [phone]

## 2012-03-05 NOTE — H&P (View-Only) (Signed)
Chief Complaint  Patient presents with  . Thyroid Nodule    Pain, lump on side of neck - thyroid recheck    HISTORY: Patient is a 61 year old female followed in my surgical practice for multinodular thyroid goiter. Patient has a known dominant mass in the left thyroid lobe. She underwent a followup ultrasound scanning on January 30, 2012. This showed interval enlargement of the dominant nodule in the left lobe which now measures 5.8 cm. Previous fine-needle aspiration biopsy had shown benign findings. However there are some calcifications present. Patient has noted some pain in the left side of the neck on occasion. This lasts for a few minutes. She denies any dysphagia. She denies any dyspnea. Recent laboratory studies include a TSH level which was normal at 1.369. Patient continues on Synthroid 100 mcg daily.  Past Medical History  Diagnosis Date  . Thyroid disease   . Palpitations      Current Outpatient Prescriptions  Medication Sig Dispense Refill  . levothyroxine (SYNTHROID, LEVOTHROID) 100 MCG tablet Take 100 mcg by mouth daily.        Marland Kitchen omega-3 acid ethyl esters (LOVAZA) 1 G capsule Take 2 g by mouth daily. 4 capsules daily          No Known Allergies   Family History  Problem Relation Age of Onset  . Cancer Brother     Stomach     History   Social History  . Marital Status: Married    Spouse Name: N/A    Number of Children: N/A  . Years of Education: N/A   Social History Main Topics  . Smoking status: Never Smoker   . Smokeless tobacco: None  . Alcohol Use: Yes     1 glass of red wine 3 times a week  . Drug Use: None  . Sexually Active: None   Other Topics Concern  . None   Social History Narrative  . None     REVIEW OF SYSTEMS - PERTINENT POSITIVES ONLY: Intermittent left neck pain, denies dysphagia, denies dyspnea, denies tremor, denies palpitations  EXAM: Filed Vitals:   02/28/12 1022  BP: 134/86  Pulse: 74  Temp: 98.2 F (36.8 C)  Resp: 14     HEENT: normocephalic; pupils equal and reactive; sclerae clear; dentition good; mucous membranes moist NECK:  Dominant mass occupying the entire left thyroid lobe and extending beneath the clavicle, firm, mobile with swallowing, nontender; right lobe without palpable mass or nodule; asymmetric on extension; no palpable anterior or posterior cervical lymphadenopathy; no supraclavicular masses; no tenderness CHEST: clear to auscultation bilaterally without rales, rhonchi, or wheezes CARDIAC: regular rate and rhythm without significant murmur; peripheral pulses are full EXT:  non-tender without edema; no deformity NEURO: no gross focal deficits; no sign of tremor   LABORATORY RESULTS: See [facility] (CHL-Epic) for most recent results   RADIOLOGY RESULTS: See [facility] (CHL-Epic) for most recent results   IMPRESSION: #1 dominant left thyroid nodule, interval enlargement, 5.8 cm, with mild compressive symptoms #2 multinodular thyroid goiter #3 hypothyroidism  PLAN: I discussed the above issues and findings at length with the patient and her husband. I have recommended total thyroidectomy for management of an enlarging left thyroid mass. I believe the risk of malignancy is approximately 10-20%. I believe she is beginning to develop compressive symptoms. We discussed the procedure, the hospital stay to be anticipated, potential complications, and the need for lifelong thyroid hormone replacement. They understand and wish to proceed.  The risks and benefits  of the procedure have been discussed at length with the patient.  The patient understands the proposed procedure, potential alternative treatments, and the course of recovery to be expected.  All of the patient's questions have been answered at this time.  The patient wishes to proceed with surgery.  Velora Heckler, MD, FACS General & Endocrine Surgery [facility]   Visit Diagnoses: 1. Multinodular  goiter (nontoxic)     Primary Care Physician: No primary provider on file.

## 2012-03-05 NOTE — Anesthesia Postprocedure Evaluation (Signed)
Anesthesia Post Note  Patient: Julia French  Procedure(s) Performed: Procedure(s) (LRB): THYROIDECTOMY (N/A)  Anesthesia type: General  Patient location: PACU  Post pain: Pain level controlled  Post assessment: Post-op Vital signs reviewed  Last Vitals:  Filed Vitals:   03/05/12 1145  BP:   Pulse:   Temp: 36.2 C  Resp: 12    Post vital signs: Reviewed  Level of consciousness: sedated  Complications: No apparent anesthesia complications

## 2012-03-05 NOTE — Interval H&P Note (Signed)
History and Physical Interval Note:  03/05/2012 9:04 AM  Julia French  has presented today for surgery, with the diagnosis of muti nodule thyroid goiter.  The various methods of treatment have been discussed with the patient and family. After consideration of risks, benefits and other options for treatment, the patient has consented to    Procedure(s) (LRB): THYROIDECTOMY (N/A) as a surgical intervention .    The patients' history has been reviewed, patient examined, no change in status, stable for surgery.  I have reviewed the patients' chart and labs.  Questions were answered to the patient's satisfaction.    Velora Heckler, MD, FACS General & Endocrine Surgery [facility] Office: [phone]   [name] Judie Petit

## 2012-03-06 ENCOUNTER — Encounter (HOSPITAL_COMMUNITY): Payer: Self-pay | Admitting: Surgery

## 2012-03-06 DIAGNOSIS — E042 Nontoxic multinodular goiter: Secondary | ICD-10-CM | POA: Diagnosis not present

## 2012-03-06 LAB — BASIC METABOLIC PANEL
BUN: 11 mg/dL (ref 6–23)
Calcium: 8 mg/dL — ABNORMAL LOW (ref 8.4–10.5)
Chloride: 102 mEq/L (ref 96–112)
GFR calc Af Amer: 88 mL/min — ABNORMAL LOW (ref >90)
GFR calc non Af Amer: 88 mL/min — ABNORMAL LOW (ref >90)
GFR calc non Af Amer: 76 mL/min — ABNORMAL LOW (ref >90)
Glucose, Bld: 121 mg/dL — ABNORMAL HIGH (ref 70–99)
Potassium: 3.6 mEq/L (ref 3.5–5.1)
Potassium: 3.8 mEq/L (ref 3.5–5.1)
Sodium: 137 mEq/L (ref 135–145)
Sodium: 142 mEq/L (ref 135–145)

## 2012-03-06 MED ORDER — SODIUM CHLORIDE 0.9 % IV SOLN
1.0000 g | INTRAVENOUS | Status: AC
  Administered 2012-03-06: 1 g via INTRAVENOUS
  Filled 2012-03-06: qty 10

## 2012-03-06 MED ORDER — SODIUM CHLORIDE 0.9 % IV SOLN
1.0000 g | Freq: Once | INTRAVENOUS | Status: AC
  Administered 2012-03-06: 1 g via INTRAVENOUS
  Filled 2012-03-06: qty 10

## 2012-03-06 NOTE — Progress Notes (Signed)
MD called about pt BP of 98/40. Says monitor until her MD comes to see her for rounds this morning.

## 2012-03-06 NOTE — Plan of Care (Signed)
Problem: Discharge Progression Outcomes Goal: Tolerating diet Outcome: Not Met (add Reason) n/v

## 2012-03-06 NOTE — Progress Notes (Signed)
Patient ID: Julia French, female   DOB: 12/29/1951, 61 y.o.   MRN: [id number]  General Surgery - [facility] - Progress Note  POD# 1  Subjective: Patient with mild dizziness last PM and this AM.  Ambulated in halls.  Taking clear liquid diet.  Denies paresthesias or spasm.  Objective: Vital signs in last 24 hours: Temp:  [96.9 F (36.1 C)-98.8 F (37.1 C)] 98.6 F (37 C) (05/29 0556) Pulse Rate:  [60-75] 72  (05/29 0556) Resp:  [12-18] 16  (05/29 0556) BP: (100-143)/(47-72) 102/47 mmHg (05/29 0213) SpO2:  [97 %-100 %] 100 % (05/29 0556) Last BM Date: 03/04/12  Intake/Output from previous day: 05/28 0701 - 05/29 0700 In: 2693.8 [I.V.:2193.8; IV Piggyback:500] Out: 1930 [Urine:1300; Emesis/NG output:630]  Exam: HEENT - clear, not icteric Neck - wound clear and dry; minimal STS; voice normal Chest - clear bilaterally Cor - RRR, no murmur Ext - no significant edema Neuro - grossly intact, no focal deficits  Lab Results:   [facility] 03/05/12 0725  WBC 4.5  HGB 13.8  HCT 39.6  PLT 296     Basename 03/06/12 0339  NA 137  K 3.6  CL 100  CO2 30  GLUCOSE 121*  BUN 10  CREATININE 0.79  CALCIUM 8.0*    Studies/Results: Dg Chest 2 View  03/05/2012  *RADIOLOGY REPORT*  Clinical Data: Preop  CHEST - 2 VIEW  Comparison: 07/06/2009  Findings: Mild cardiomegaly.  The stable bronchitic stages.  No consolidation, mass, pneumothorax, pleural effusion.  IMPRESSION: Cardiomegaly without decompensation.  Original Report Authenticated By: Donavan Burnet, M.D.    Assessment / Plan: 1.  Status post total thyroidectomy  - hypocalcemic this AM - will give IV Calcium gluconate X2 and repeat calcium level  - advance to regular diet  - monitor BP and dizziness  - home later today or tomorrow  - path pending  Velora Heckler, MD, [facility] Office: [phone]  03/06/2012

## 2012-03-06 NOTE — Op Note (Signed)
Julia French, Julia French                 ACCOUNT NO.:  [id number]  MEDICAL RECORD NO.:  [id number]  LOCATION:  [facility]                         FACILITY:  [facility]  PHYSICIAN:  Velora Heckler, MD      DATE OF BIRTH:  12/29/1951  DATE OF PROCEDURE: 05 Mar 2012                               OPERATIVE REPORT   PREOPERATIVE DIAGNOSIS:  Multinodular thyroid goiter, dominant left thyroid nodule.  POSTOPERATIVE DIAGNOSIS:  Multinodular thyroid goiter, dominant left thyroid nodule.  SURGEON:  Velora Heckler, MD, FACS  ANESTHESIA:  General per Dr. Lucille Passy  ESTIMATED BLOOD LOSS:  Minimal.  PREPARATION:  ChloraPrep.  COMPLICATIONS:  None.  INDICATIONS:  The patient is a 61 year old female followed with a dominant left thyroid nodule.  This has continued to enlarge in size. It now measures 5.8 cm.  The patient has developed some mild compressive symptoms.  On ultrasound, there are some calcifications present.  The patient now comes to surgery for thyroidectomy for definitive diagnosis.  BODY OF REPORT:  The procedure was done in OR #11 at the [facility].  The patient was brought to the operating room and placed in supine position on the operating room table.  Following administration of general anesthesia, the patient is positioned and then prepped and draped in the usual strict aseptic fashion.  After ascertaining that an adequate level of anesthesia had been achieved, a Kocher incision was made with a number 15 blade.  Dissection was carried through subcutaneous tissues and platysma.  Hemostasis was obtained with electrocautery.  Skin flaps were elevated cephalad and caudad from the thyroid notch to the sternal notch.  A Mahorner self-retaining retractor was placed for exposure.  Strap muscles were incised in the midline. Dissection was begun on the left.  Strap muscles were reflected laterally exposing an enlarged left thyroid lobe.  The lobe was gently mobilized.   Venous tributaries were divided between Ligaclips with the Harmonic scalpel.  Superior pole vessels were dissected out individually and divided between small and medium Ligaclips with the Harmonic scalpel.  Parathyroid tissue was identified and preserved.  Inferior venous tributaries were divided between Ligaclips with the Harmonic scalpel.  The left lobe was rolled anteriorly.  Branches of the inferior thyroid artery were divided between small Ligaclips.  The inferior parathyroid gland was identified and preserved.  The gland was mobilized up and onto the anterior trachea.  Ligament of Allyson Sabal was released and the gland was mobilized across the midline.  There was a small pyramidal lobe, which was resected en bloc with the isthmus.  Dry pack was placed in the left neck.  Next the right thyroid lobe was exposed by reflecting the strap muscles laterally.  The right lobe was normal in size.  The superior pole vessels were dissected out and divided individually between small Ligaclips with the Harmonic scalpel.  Inferior venous tributaries were divided between Ligaclips with the Harmonic scalpel.  The gland was rolled anteriorly.  The recurrent laryngeal nerve was identified and preserved.  The inferior parathyroid gland was identified and preserved. Branches of the inferior thyroid artery were divided between small Ligaclips.  Ligament of Allyson Sabal was released  with electrocautery and the gland was mobilized up and onto the anterior trachea.  It was excised off the trachea using electrocautery for hemostasis.  Suture was used to mark the left superior pole.  The entire thyroid gland was then submitted to Pathology for review.   The neck was irrigated with warm saline.  Good hemostasis was achieved bilaterally.  Surgicel was placed in the operative field bilaterally. Strap muscles were reapproximated in the midline with interrupted 3-0 Vicryl sutures.  Platysma was closed with interrupted 3-0  Vicryl sutures.  Skin was closed with a running 4-0 Monocryl subcuticular suture.  The wound was washed and dried, and Benzoin and Steri-Strips were applied.  Sterile dressings were applied.  The patient was awakened from anesthesia and brought to the recovery room.  The patient tolerated the procedure well.   Velora Heckler, MD, FACS   TMG/MEDQ  D:  03/05/2012  T:  03/06/2012  Job:  [id number]

## 2012-03-07 ENCOUNTER — Telehealth (INDEPENDENT_AMBULATORY_CARE_PROVIDER_SITE_OTHER): Payer: Self-pay

## 2012-03-07 DIAGNOSIS — E042 Nontoxic multinodular goiter: Secondary | ICD-10-CM | POA: Diagnosis not present

## 2012-03-07 LAB — BASIC METABOLIC PANEL
CO2: 30 mEq/L (ref 19–32)
Chloride: 101 mEq/L (ref 96–112)
GFR calc Af Amer: >90 mL/min (ref >90)
Potassium: 3.5 mEq/L (ref 3.5–5.1)
Sodium: 139 mEq/L (ref 135–145)

## 2012-03-07 MED ORDER — SODIUM CHLORIDE 0.9 % IV SOLN
1.0000 g | INTRAVENOUS | Status: AC
  Administered 2012-03-07: 1 g via INTRAVENOUS
  Filled 2012-03-07: qty 10

## 2012-03-07 MED ORDER — CALCIUM CARBONATE 1250 (500 CA) MG PO TABS: 1250.0000 mg | ORAL_TABLET | Freq: Four times a day (QID) | ORAL | Status: DC

## 2012-03-07 MED ORDER — HYDROCODONE-ACETAMINOPHEN 5-325 MG PO TABS: 1.0000 | ORAL_TABLET | ORAL | Status: AC | PRN

## 2012-03-07 MED ORDER — LEVOTHYROXINE SODIUM 100 MCG PO TABS: 100.0000 ug | ORAL_TABLET | Freq: Every day | ORAL | Status: DC

## 2012-03-07 NOTE — Progress Notes (Signed)
Quick Note:  Please contact patient and notify of benign pathology results.  Julia Aquilino M. Sueanne Maniaci, MD, FACS [facility] Office: [phone]   ______ 

## 2012-03-07 NOTE — Discharge Summary (Signed)
Physician Discharge Summary [facility]  Patient ID: Julia French MRN: [id number] DOB/AGE: 62/22/1953 62 y.o.  Admit date: 03/05/2012 Discharge date: 03/07/2012  Admission Diagnoses:  Multinodular goiter  Discharge Diagnoses:  Active Problems:  * No active hospital problems. *    Discharged Condition: good  Hospital Course: patient admitted after total thyroidectomy for multinodular goiter.  Post op course complicated by mild hypocalcemia.  Patient received IV calcium gluconate for a total of three doses.  Calcium improved to 8.2 prior to discharge.  Patient remained asymptomatic.  Consults: None  Significant Diagnostic Studies: labs: calcium levels  Treatments: surgery: total thyroidectomy  Discharge Exam: Blood pressure 109/56, pulse 72, temperature 98.7 F (37.1 C), temperature source Oral, resp. rate 18, height 5\' 3"  (1.6 m), weight 146 lb 6 oz (66.395 kg), SpO2 94.00%. HEENT - clear Neck - wound clear and dry, steristrips in place, minimal STS, voice normal Chest - clear Cor - RRR  Disposition: Home with family  Discharge Orders    Future Appointments: Provider: Department: Dept Phone: Center:   03/27/2012 12:00 PM Velora Heckler, MD [facility] [phone] None     Future Orders Please Complete By Expires   Diet - low sodium heart healthy      Increase activity slowly      Discharge instructions      Comments:   [facility], [place] [phone]  THYROID & PARATHYROID SURGERY -- POST OP INSTRUCTIONS  Always review your discharge instruction sheet from the facility where your surgery was performed.  A prescription for pain medication may be given to you upon discharge.  Take your pain medication as prescribed, if needed.  If narcotic pain medicine is not needed, then you may take acetaminophen (Tylenol) or ibuprofen (Advil) as needed. Take your usually prescribed medications unless otherwise directed. If you need a refill on  your pain medication, please contact your pharmacy. They will contact our office to request authorization.  Prescriptions will not be processed after 5 pm or on weekends. Start with a light diet upon arrival home, such as soup and crackers, etc.  Be sure to drink pleny of fluids daily.  Resume your normal diet the day after surgery. Most patients will experience some swelling and bruising on the chest and neck area.  Ice packs will help.  Swelling and bruising can take several days to resolve.  It is common to experience some constipation if taking pain medication after surgery.  Increasing fluid intake and taking a stool softener will usually help or prevent this problem.  A mild laxative (Milk of Magnesia or Miralax) should be taken according to package directions if there are no bowel movements after 48 hours. You may remove your bandages 24-48 hours after surgery, and you may shower at that time.  You have steri-strips (small skin tapes) in place directly over the incision.  These strips should be left on the skin for 7-10 days and then removed. You may resume regular (light) daily activities beginning the next day--such as daily self-care, walking, climbing stairs--gradually increasing activities as tolerated.  You may have sexual intercourse when it is comfortable.  Refrain from any heavy lifting or straining until approved by your doctor.  You may drive when you no longer are taking prescription pain medication, you can comfortably wear a seatbelt, and you can safely maneuver your car and apply brakes. You should see your doctor in the office for a follow-up appointment approximately two weeks after your surgery.  Make sure that you call for this appointment within a day or two after you arrive home to insure a convenient appointment time.  WHEN TO CALL YOUR DOCTOR: Fever over 101.5 Inability to urinate Nausea and/or vomiting - persistent Extreme swelling or bruising Continued bleeding from  incision Increased pain, redness, or drainage from the incision Difficulty swallowing or breathing Muscle cramping or spasms Numbness or tingling in hands or feet or around lips  The clinic staff is available to answer your questions during regular business hours.  Please don't hesitate to call and ask to speak to one of the nurses if you have concerns.  [email or web address]    Remove dressing in 24 hours        Medication List  As of 03/07/2012 10:21 AM   TAKE these medications         calcium carbonate 1250 MG tablet   Commonly known as: OS-CAL - dosed in mg of elemental calcium   Take 1 tablet (1,250 mg total) by mouth 4 (four) times daily.      calcium-vitamin D 250-125 MG-UNIT per tablet   Commonly known as: OSCAL WITH D   Take 1 tablet by mouth daily.      co-enzyme Q-10 30 MG capsule   Take 100 mg by mouth 2 (two) times daily.      cyclobenzaprine 10 MG tablet   Commonly known as: FLEXERIL   Take 10 mg by mouth at bedtime as needed. For leg pain      HYDROcodone-acetaminophen 5-325 MG per tablet   Commonly known as: NORCO   Take 1-2 tablets by mouth every 4 (four) hours as needed for pain.      levothyroxine 100 MCG tablet   Commonly known as: SYNTHROID, LEVOTHROID   Take 1 tablet (100 mcg total) by mouth daily.      omega-3 acid ethyl esters 1 G capsule   Commonly known as: LOVAZA   Take 1-2 g by mouth 2 (two) times daily.           Velora Heckler, MD, FACS General & Endocrine Surgery [facility] Office: [phone]   Signed: Velora Heckler 03/07/2012, 10:21 AM

## 2012-03-07 NOTE — Telephone Encounter (Signed)
Pt notified of path result and to keep appt 6-19 with labs on 6-17.

## 2012-03-15 ENCOUNTER — Telehealth (INDEPENDENT_AMBULATORY_CARE_PROVIDER_SITE_OTHER): Payer: Self-pay | Admitting: General Surgery

## 2012-03-15 NOTE — Telephone Encounter (Signed)
Pt's husband calling to clarify meds and supplements ordered postop.  He understands in importance of taking them.  Pt is feeling good since surgery and is not in need of pain meds at all.  They will call back as needed.

## 2012-03-18 ENCOUNTER — Telehealth (INDEPENDENT_AMBULATORY_CARE_PROVIDER_SITE_OTHER): Payer: Self-pay

## 2012-03-18 NOTE — Telephone Encounter (Signed)
Pharmacy called questioning Rx for Hydrocodone 5/325 for the patient.  Evidently the husband did not have the rx filled after surgery, stating his wife was doing so well she did not need it.  Now the patient is requesting the pain medication.  I okayed for #30 with no refills.

## 2012-03-25 ENCOUNTER — Other Ambulatory Visit (INDEPENDENT_AMBULATORY_CARE_PROVIDER_SITE_OTHER): Payer: Self-pay | Admitting: Surgery

## 2012-03-26 LAB — CALCIUM: Calcium: 9.3 mg/dL (ref 8.4–10.5)

## 2012-03-27 ENCOUNTER — Encounter (INDEPENDENT_AMBULATORY_CARE_PROVIDER_SITE_OTHER): Payer: Self-pay | Admitting: Surgery

## 2012-03-27 ENCOUNTER — Ambulatory Visit (INDEPENDENT_AMBULATORY_CARE_PROVIDER_SITE_OTHER): Payer: BC Managed Care – PPO | Admitting: Surgery

## 2012-03-27 ENCOUNTER — Telehealth (INDEPENDENT_AMBULATORY_CARE_PROVIDER_SITE_OTHER): Payer: Self-pay

## 2012-03-27 VITALS — BP 118/62 | HR 68 | Temp 97.3°F | Resp 14 | Ht 63.0 in | Wt 151.1 lb

## 2012-03-27 DIAGNOSIS — E042 Nontoxic multinodular goiter: Secondary | ICD-10-CM

## 2012-03-27 NOTE — Telephone Encounter (Signed)
Pt left lab slip at check out. Lab slip with avs mailed to pts home address.

## 2012-03-27 NOTE — Patient Instructions (Signed)
  COCOA BUTTER & VITAMIN E CREAM  (Palmer's or other brand)  Apply cocoa butter/vitamin E cream to your incision 2 - 3 times daily.  Massage cream into incision for one minute with each application.  Use sunscreen (50 SPF or higher) for first 6 months after surgery if area is exposed to sun.  You may substitute Mederma or other scar reducing creams as desired.   

## 2012-03-27 NOTE — Progress Notes (Signed)
General Surgery [facility]  Visit Diagnoses: 1. Multinodular goiter (nontoxic)     HISTORY: The patient returns for her first postoperative visit having undergone total thyroidectomy on Mar 05, 2012. Final pathology shows adenomatous nodules and a background of nodular hyperplasia and chronic lymphocytic thyroiditis. There was no evidence of malignancy. Followup serum calcium level is normal at 9.3. Patient is currently taking levothyroxine 100 mcg daily.  EXAM: Surgical wound is healing nicely. Steri-Strips are removed in the office. Mild soft tissue swelling. Voice quality is normal.  IMPRESSION: #1 status post total thyroidectomy for multinodular goiter #2 post surgical hypothyroidism  PLAN: Patient will begin applying topical creams or incision. She will have a TSH level checked in 3 weeks. She will return to see me for a final wound check in 6 weeks.  Velora Heckler, MD, FACS General & Endocrine Surgery [facility]

## 2012-05-13 ENCOUNTER — Encounter (INDEPENDENT_AMBULATORY_CARE_PROVIDER_SITE_OTHER): Payer: BC Managed Care – PPO | Admitting: Surgery

## 2012-05-17 ENCOUNTER — Telehealth (INDEPENDENT_AMBULATORY_CARE_PROVIDER_SITE_OTHER): Payer: Self-pay

## 2012-05-17 NOTE — Telephone Encounter (Signed)
Pt called stating she was up last night with legs jumping and her mind racing. No cramping or clubbing.  Pt states she was not able to sleep. Pt states this just began last night. Reviewed with Dr Gerrit Friends. Pt advised per Dr Ardine Eng request to see her her PCP to work up these symptoms.

## 2012-06-12 ENCOUNTER — Encounter (INDEPENDENT_AMBULATORY_CARE_PROVIDER_SITE_OTHER): Payer: Self-pay | Admitting: Surgery

## 2012-06-12 ENCOUNTER — Ambulatory Visit (INDEPENDENT_AMBULATORY_CARE_PROVIDER_SITE_OTHER): Payer: BC Managed Care – PPO | Admitting: Surgery

## 2012-06-12 VITALS — BP 124/84 | HR 68 | Temp 97.6°F | Ht 64.0 in | Wt 153.6 lb

## 2012-06-12 DIAGNOSIS — E042 Nontoxic multinodular goiter: Secondary | ICD-10-CM

## 2012-06-12 DIAGNOSIS — E890 Postprocedural hypothyroidism: Secondary | ICD-10-CM

## 2012-06-12 MED ORDER — LEVOTHYROXINE SODIUM 100 MCG PO TABS: 100.0000 ug | ORAL_TABLET | Freq: Every day | ORAL | Status: DC

## 2012-06-12 NOTE — Patient Instructions (Signed)
  COCOA BUTTER & VITAMIN E CREAM  (Palmer's or other brand)  Apply cocoa butter/vitamin E cream to your incision 2 - 3 times daily.  Massage cream into incision for one minute with each application.  Use sunscreen (50 SPF or higher) for first 6 months after surgery if area is exposed to sun.  You may substitute Mederma or other scar reducing creams as desired.   

## 2012-06-12 NOTE — Progress Notes (Signed)
General Surgery [facility]  Visit Diagnoses: 1. Multinodular goiter (nontoxic)   2. Hypothyroidism, postsurgical     HISTORY: Patient underwent total thyroidectomy May 2013. Postoperatively she has done well. She is taking Synthroid 88 mcg daily. TSH level from mid July is 3.520.    PERTINENT REVIEW OF SYSTEMS: Patient states she feels well. She denies fatigue. She denies palpitations. She denies tremor.  EXAM: HEENT: normocephalic; pupils equal and reactive; sclerae clear; dentition good; mucous membranes moist NECK:  Well-healed surgical incision with no evidence of seroma or infection; voice quality is normal; symmetric on extension; no palpable anterior or posterior cervical lymphadenopathy; no supraclavicular masses; no tenderness CHEST: clear to auscultation bilaterally without rales, rhonchi, or wheezes CARDIAC: regular rate and rhythm without significant murmur; peripheral pulses are full EXT:  non-tender without edema; no deformity NEURO: no gross focal deficits; no sign of tremor   IMPRESSION: Status post total thyroidectomy  PLAN: Patient's dosage of Synthroid will be increased to 100 mcg daily. We will check a TSH level in 6 weeks. My goal is to keep her TSH level between 1.0 and 2.0. We will turn over the management of her thyroid hormone replacement to her primary care physician at this time.  Patient will return for followup as needed.  Velora Heckler, MD, [facility] Office: [phone]

## 2012-06-13 ENCOUNTER — Other Ambulatory Visit (INDEPENDENT_AMBULATORY_CARE_PROVIDER_SITE_OTHER): Payer: Self-pay | Admitting: Surgery

## 2012-06-13 ENCOUNTER — Telehealth (INDEPENDENT_AMBULATORY_CARE_PROVIDER_SITE_OTHER): Payer: Self-pay

## 2012-06-13 ENCOUNTER — Telehealth (INDEPENDENT_AMBULATORY_CARE_PROVIDER_SITE_OTHER): Payer: Self-pay | Admitting: General Surgery

## 2012-06-13 DIAGNOSIS — E890 Postprocedural hypothyroidism: Secondary | ICD-10-CM

## 2012-06-13 MED ORDER — LEVOTHYROXINE SODIUM 112 MCG PO TABS: 112.0000 ug | ORAL_TABLET | Freq: Every day | ORAL | Status: DC

## 2012-06-13 NOTE — Telephone Encounter (Signed)
Pt called in b/c she told Dr Gerrit Friends that she has been taking Synthroid but she was wrong she really has been taking . The pt was told at her visit to then go up on the dose to . Pls call pt to let her know if you need her to go up again on the dose since she has been taking he the whole entire time. The pt checked her pill bottle as soon as she went home to confirm the dose. Pls advise.

## 2012-06-13 NOTE — Telephone Encounter (Signed)
LMOM ZO:XWRUEA of dosage for synthroid to per Dr Ardine Eng request.

## 2012-06-13 NOTE — Telephone Encounter (Signed)
[facility]/ [place]/ AMANDA CALLED TO VERIFY CORRECT DOSAGE FOR LEVOTHYROXIN. YESTERDAY'S ORDER WAS FOR AND TODAYS ORDER IS FOR . I REVIEWED THIS WITH DR. Nat Christen AND HE SAID THE CORRECT DOSE SHOULD BE . I CONTACTED [facility]/ / [phone] AND INFORMED JANELLE OF CORRECT DOSE, 112MCG/GY

## 2012-06-26 ENCOUNTER — Encounter (INDEPENDENT_AMBULATORY_CARE_PROVIDER_SITE_OTHER): Payer: Self-pay | Admitting: Surgery

## 2012-06-26 ENCOUNTER — Ambulatory Visit (INDEPENDENT_AMBULATORY_CARE_PROVIDER_SITE_OTHER): Payer: BC Managed Care – PPO | Admitting: Surgery

## 2012-06-26 VITALS — BP 122/82 | HR 68 | Temp 97.6°F | Resp 16 | Ht 64.0 in | Wt 152.0 lb

## 2012-06-26 DIAGNOSIS — E890 Postprocedural hypothyroidism: Secondary | ICD-10-CM

## 2012-06-26 NOTE — Progress Notes (Signed)
General Surgery [facility]  Visit Diagnoses: 1. Hypothyroidism, postsurgical     HISTORY: Patient returns with concerns regarding her thyroidectomy incision. She is having intermittent episodes of tingling and burning sensation around the incision, primarily above the level of the incision. She also notes some fatty tissue density at the sternal notch.  EXAM: Surgical wound is healing very nicely. There is no sign of infection. There is no sign of seroma. Voice quality is normal. There is some fatty tissue density at the sternal notch which may represent a developing lipoma or merely postsurgical trauma.  IMPRESSION: Status post total thyroidectomy for benign disease  PLAN: I discussed these findings with the patient and her husband. I reassured her that likely these will resolve with time. She will continue to apply topical creams to her incision. I have offered to see her back in one year for a wound check. Certainly if she develops a significant lipoma below the incision we can discuss surgical excision as an outpatient procedure.  Patient will return in one year.  Velora Heckler, MD, FACS General & Endocrine Surgery [facility]

## 2012-06-26 NOTE — Patient Instructions (Signed)
  COCOA BUTTER & VITAMIN E CREAM  (Palmer's or other brand)  Apply cocoa butter/vitamin E cream to your incision 2 - 3 times daily.  Massage cream into incision for one minute with each application.  Use sunscreen (50 SPF or higher) for first 6 months after surgery if area is exposed to sun.  You may substitute Mederma or other scar reducing creams as desired.   

## 2012-07-02 ENCOUNTER — Encounter (INDEPENDENT_AMBULATORY_CARE_PROVIDER_SITE_OTHER): Payer: Self-pay

## 2012-08-05 DIAGNOSIS — J310 Chronic rhinitis: Secondary | ICD-10-CM | POA: Insufficient documentation

## 2012-11-20 ENCOUNTER — Telehealth (INDEPENDENT_AMBULATORY_CARE_PROVIDER_SITE_OTHER): Payer: Self-pay

## 2012-11-20 NOTE — Telephone Encounter (Signed)
Refill levothyroxin 112 mcg x 11 refills faxed to [facility] [phone], per Dr Gerrit Friends.

## 2012-11-29 ENCOUNTER — Other Ambulatory Visit: Payer: Self-pay | Admitting: Obstetrics and Gynecology

## 2012-11-29 DIAGNOSIS — R922 Inconclusive mammogram: Secondary | ICD-10-CM

## 2012-12-09 ENCOUNTER — Other Ambulatory Visit: Payer: BC Managed Care – PPO

## 2012-12-12 ENCOUNTER — Other Ambulatory Visit: Payer: Self-pay | Admitting: Obstetrics and Gynecology

## 2012-12-18 ENCOUNTER — Ambulatory Visit
Admission: RE | Admit: 2012-12-18 | Discharge: 2012-12-18 | Disposition: A | Discharge status: Home / Self Care | Payer: BC Managed Care – PPO | Source: Ambulatory Visit | Attending: Obstetrics and Gynecology | Admitting: Obstetrics and Gynecology

## 2012-12-18 DIAGNOSIS — R922 Inconclusive mammogram: Secondary | ICD-10-CM

## 2012-12-25 ENCOUNTER — Ambulatory Visit
Admission: RE | Admit: 2012-12-25 | Discharge: 2012-12-25 | Disposition: A | Discharge status: Home / Self Care | Payer: No Typology Code available for payment source | Source: Ambulatory Visit | Attending: Obstetrics and Gynecology | Admitting: Obstetrics and Gynecology

## 2012-12-25 MED ORDER — GADOBENATE DIMEGLUMINE 529 MG/ML IV SOLN
14.0000 mL | Freq: Once | INTRAVENOUS | Status: AC | PRN
  Administered 2012-12-25: 14 mL via INTRAVENOUS

## 2013-02-28 ENCOUNTER — Telehealth (INDEPENDENT_AMBULATORY_CARE_PROVIDER_SITE_OTHER): Payer: Self-pay | Admitting: General Surgery

## 2013-02-28 NOTE — Telephone Encounter (Signed)
Pt called to report she has had increasing symptoms of tingling and numbness from her shoulders to her fingers over the last two to three days.  She is not taking her Os-Cal on a regular basis.  Asking if she should go to [facility] to get a calcium level drawn.  Paged and updated Dr. Gerrit Friends; orders for her to call her PCP for treatment.  Pt updated with his recommendation.

## 2013-05-30 ENCOUNTER — Telehealth (INDEPENDENT_AMBULATORY_CARE_PROVIDER_SITE_OTHER): Payer: Self-pay | Admitting: *Deleted

## 2013-05-30 NOTE — Telephone Encounter (Signed)
I called pt and reviewed Dr Ardine Eng past notes with pt. Pt is having her thyroid levels and medication followed by her PCP. Pt to call her PCP to have thyroid medication refilled. Pt states she understands and will call PCP for refill.

## 2013-05-30 NOTE — Telephone Encounter (Signed)
Patient called in to ask for a refill of her Levothyroxine.  Patient states her PMD changed the dosage from the that Dr. Gerrit Friends had ordered her down to .  Patient states she is due to come see Dr. Gerrit Friends at the end of September however she only has 4 more days of medication left.  Explained that a message will be sent to Dr. Gerrit Friends for his decision and then we will give her a call back.  Patient states understanding and agreeable with plan at this time.

## 2013-07-08 ENCOUNTER — Encounter (INDEPENDENT_AMBULATORY_CARE_PROVIDER_SITE_OTHER): Payer: Self-pay | Admitting: Surgery

## 2013-07-08 ENCOUNTER — Ambulatory Visit (INDEPENDENT_AMBULATORY_CARE_PROVIDER_SITE_OTHER): Payer: BC Managed Care – PPO | Admitting: Surgery

## 2013-07-08 VITALS — BP 124/82 | HR 61 | Temp 96.6°F | Ht 64.0 in | Wt 146.0 lb

## 2013-07-08 DIAGNOSIS — L905 Scar conditions and fibrosis of skin: Secondary | ICD-10-CM | POA: Insufficient documentation

## 2013-07-08 DIAGNOSIS — E890 Postprocedural hypothyroidism: Secondary | ICD-10-CM

## 2013-07-08 NOTE — Patient Instructions (Signed)
May take Os-Cal with Vitamin D daily as a vitamin supplement.

## 2013-07-08 NOTE — Progress Notes (Signed)
General Surgery [facility]  Chief Complaint  Patient presents with  . Follow-up    total thyroidectomy 02/2013    HISTORY: Patient is a 62 year old female who underwent total thyroidectomy in May 2014 for multinodular goiter. She is currently taking Synthroid 75 mcg daily and to the direction of her primary care physician. She did have a recent TSH level drawn at their office. They will contact her with the results.  Patient previously had some concerns regarding her incision. She had some abnormal sensations. She was concerned about soft tissue swelling. She returns today for a wound check.  PERTINENT REVIEW OF SYSTEMS: No pain, swelling, or itching in the incision at this time. No sign of new growth in the region of the thyroid. Denies dysphagia. Normal voice. Denies tremors. Occasional palpitation.  EXAM: HEENT: normocephalic; pupils equal and reactive; sclerae clear; dentition good; mucous membranes moist NECK:  Well-healed surgical incision; no palpable masses in the thyroid bed; symmetric on extension; no palpable anterior or posterior cervical lymphadenopathy; no supraclavicular masses; no tenderness CHEST: clear to auscultation bilaterally without rales, rhonchi, or wheezes CARDIAC: regular rate and rhythm without significant murmur; peripheral pulses are full EXT:  non-tender without edema; no deformity NEURO: no gross focal deficits; no sign of tremor   IMPRESSION: #1 resolved incisional pain #2 postsurgical hypothyroidism  PLAN: Patient will continue to apply a moisturizing creams to the area of her surgical incision. She will see her primary care physician for thyroid hormone dosage adjustment as needed.  Patient will return for surgical care as needed.  Velora Heckler, MD, [facility] Office: [phone]  Visit Diagnoses: 1. Hypothyroidism, postsurgical   2. Pain in surgical scar

## 2013-11-19 ENCOUNTER — Other Ambulatory Visit: Payer: Self-pay

## 2013-11-19 DIAGNOSIS — Z1231 Encounter for screening mammogram for malignant neoplasm of breast: Secondary | ICD-10-CM

## 2013-12-08 ENCOUNTER — Ambulatory Visit
Admission: RE | Admit: 2013-12-08 | Discharge: 2013-12-08 | Disposition: A | Discharge status: Home / Self Care | Payer: BC Managed Care – PPO | Source: Ambulatory Visit

## 2013-12-08 DIAGNOSIS — Z1231 Encounter for screening mammogram for malignant neoplasm of breast: Secondary | ICD-10-CM

## 2013-12-22 ENCOUNTER — Telehealth (INDEPENDENT_AMBULATORY_CARE_PROVIDER_SITE_OTHER): Payer: Self-pay | Admitting: General Surgery

## 2013-12-22 NOTE — Telephone Encounter (Signed)
Pt called for advice on a new "knot" on the left side of her neck, slightly above her thyroidectomy incision.  Not suspicious for new goiter, given her entire thyroid was removed.  Advised her to see her PCP for evaluation, which she agrees to do.

## 2015-01-09 IMAGING — MG MM SCREENING BREAST TOMO BILATERAL
8 of 12 series · 8 of 28 positions shown · non-contrast
Comparison: Previous exam(s).

CLINICAL DATA: Screening.

EXAM:
DIGITAL SCREENING BILATERAL MAMMOGRAM WITH 3D TOMO WITH CAD

[L MLO]
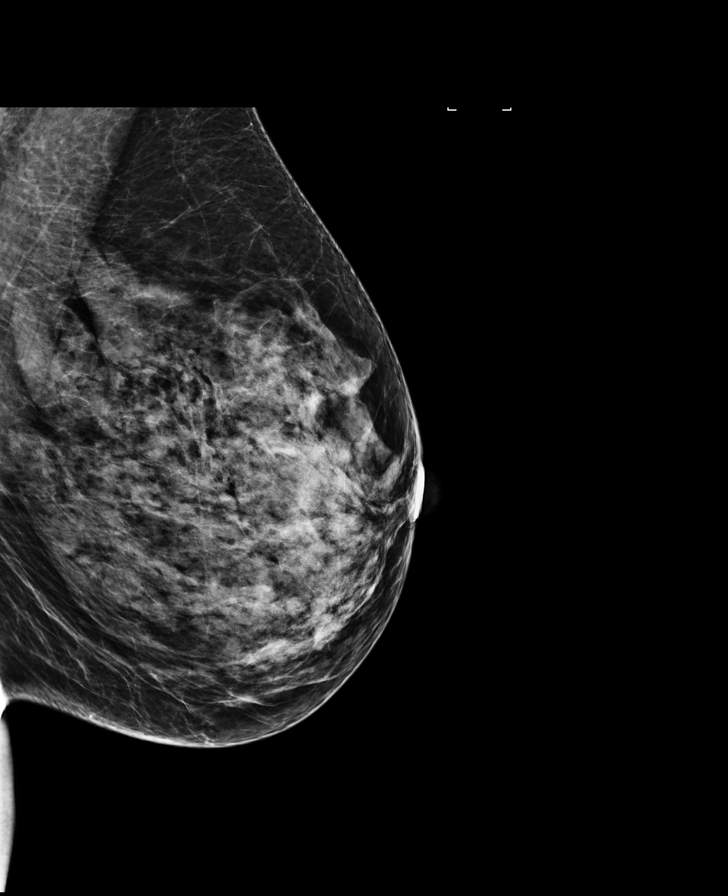

[L CC]
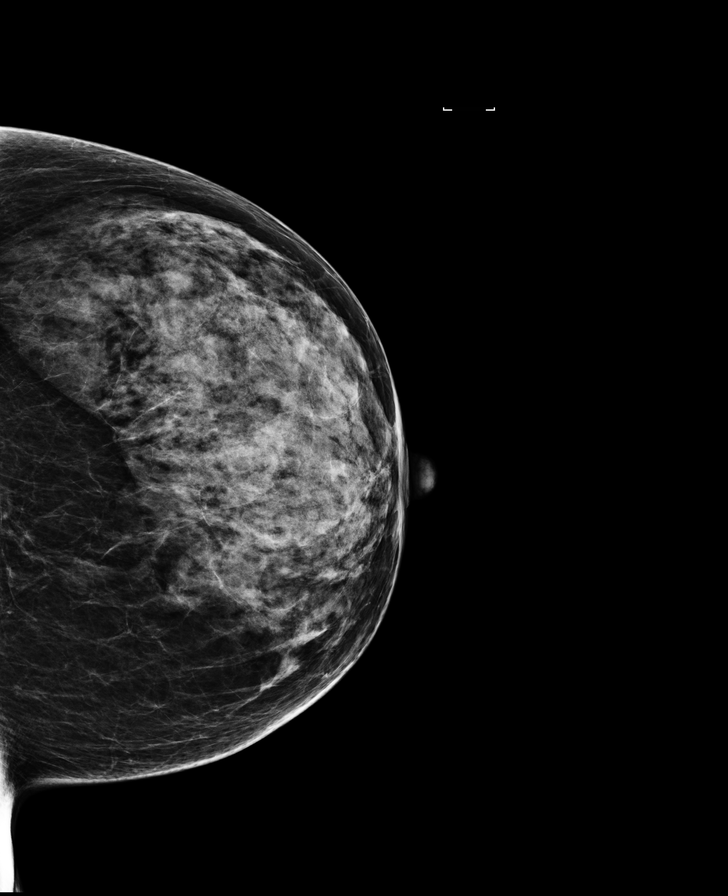

[R CC]
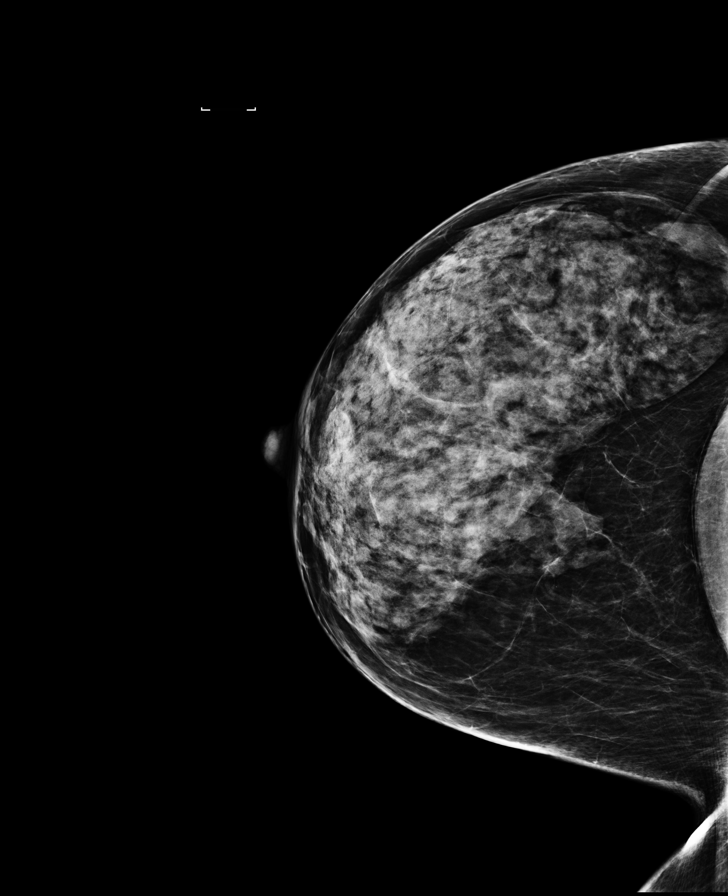

[R MLO]
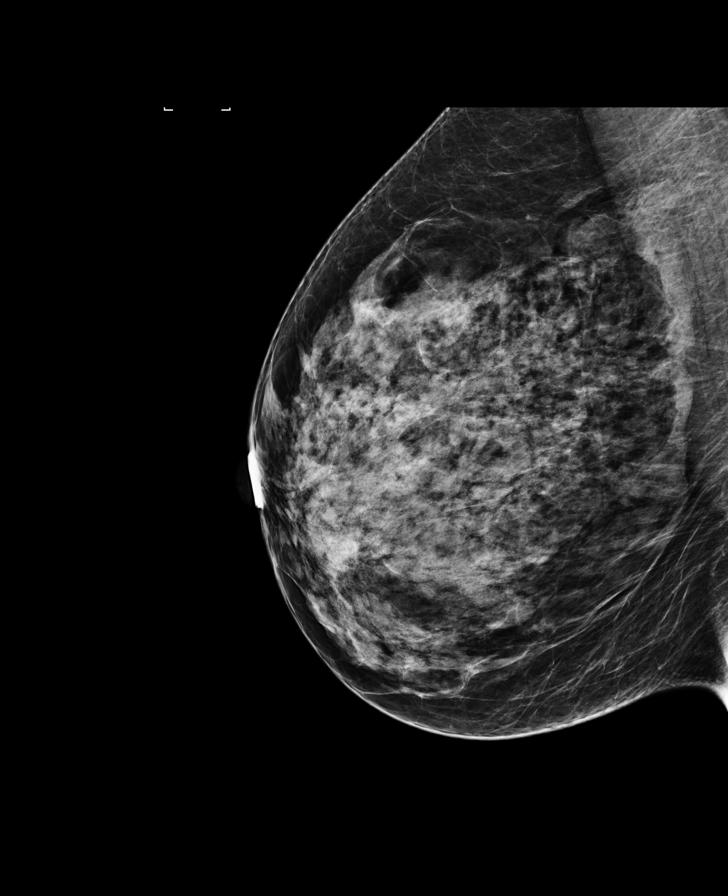

[L MLO synth-2D]
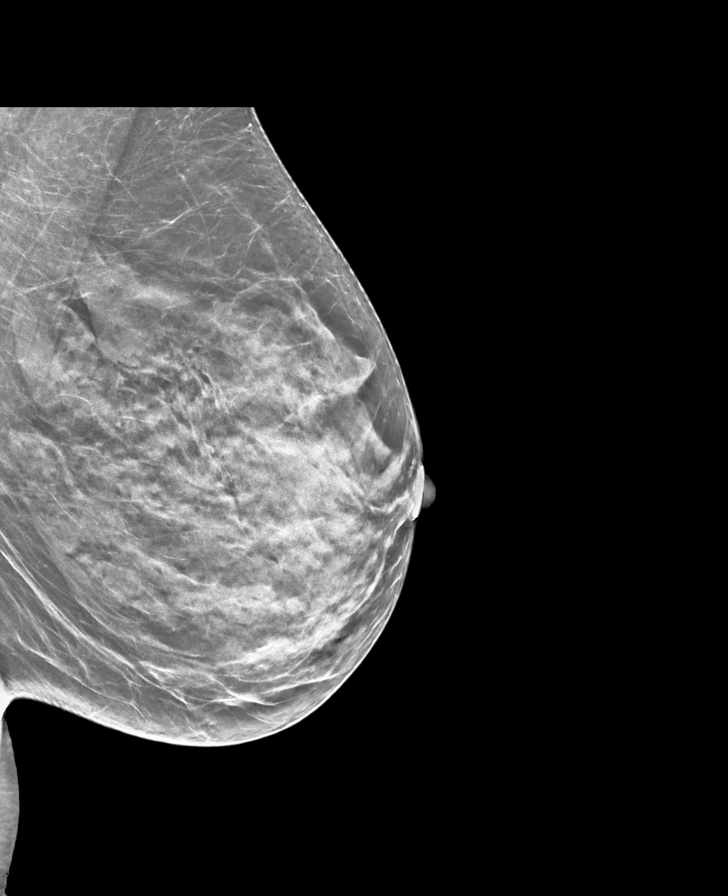

[L CC synth-2D]
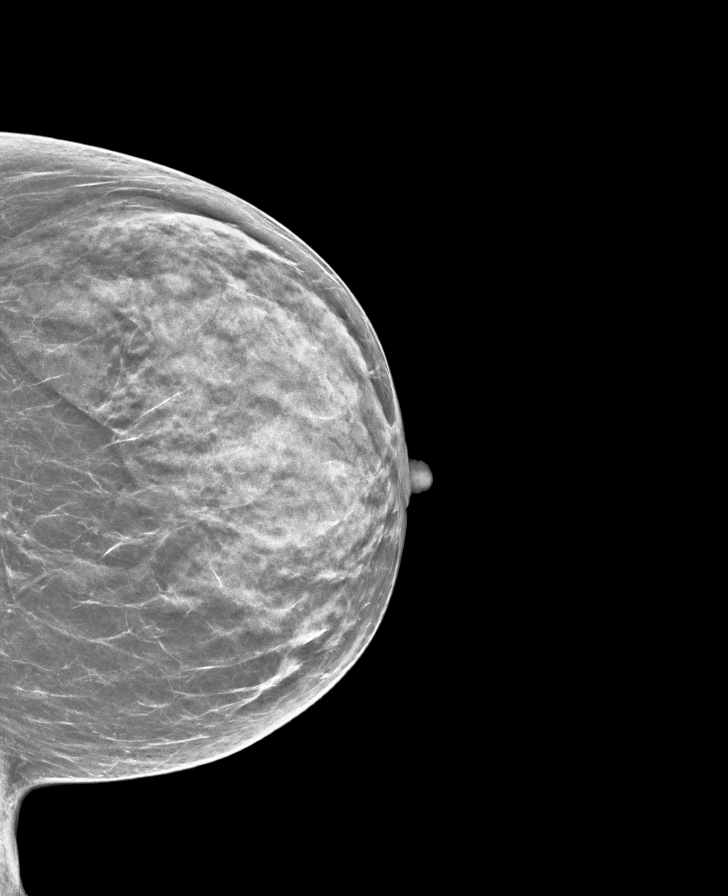

[R CC synth-2D]
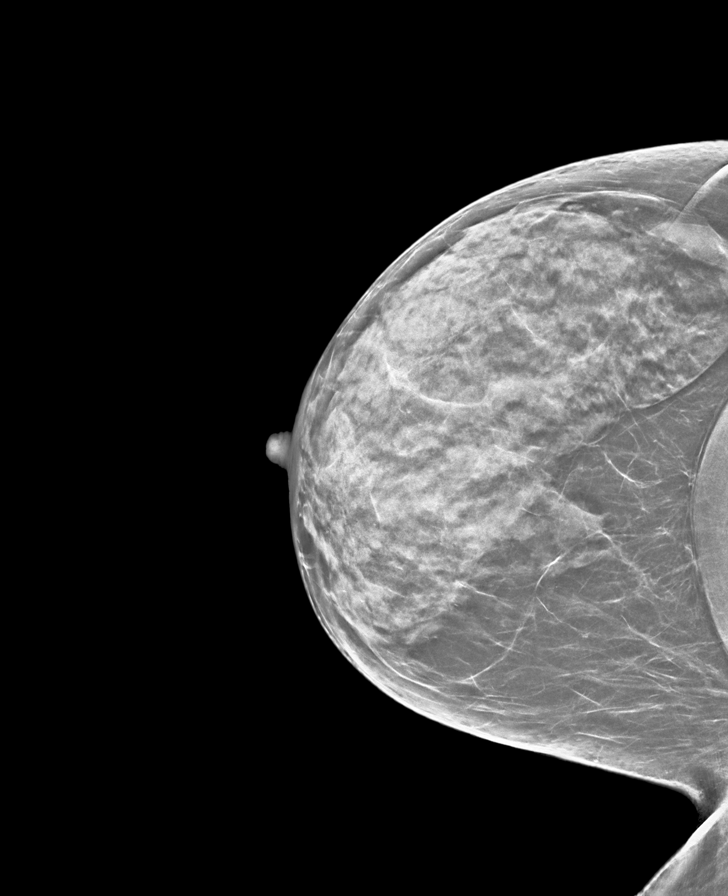

[R MLO synth-2D]
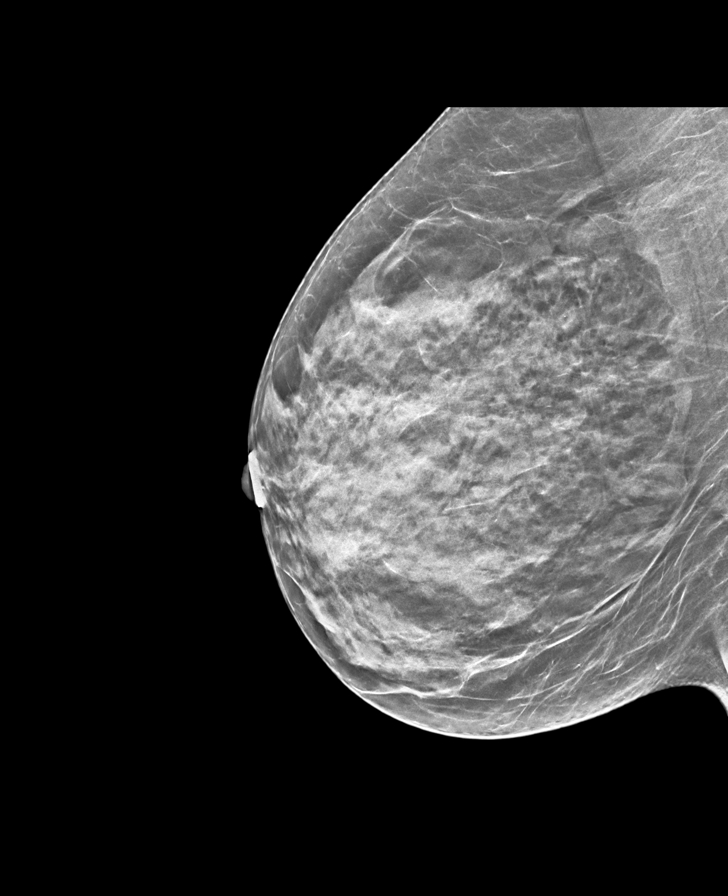

[8 of 28 positions shown; findings below may reference images not displayed]

ACR Breast Density Category d: The breast tissue is extremely dense,
which lowers the sensitivity of mammography.
FINDINGS: There are no findings suspicious for malignancy. Images were
processed with CAD.
IMPRESSION: No mammographic evidence of malignancy. A result letter of this
screening mammogram will be mailed directly to the patient.

RECOMMENDATION:
Screening mammogram in one year. (Code:[id number])

BI-RADS CATEGORY  1: Negative.

## 2015-10-10 HISTORY — PX: KNEE SURGERY: SHX244

## 2016-02-20 ENCOUNTER — Ambulatory Visit (HOSPITAL_COMMUNITY)
Admission: EM | Admit: 2016-02-20 | Discharge: 2016-02-20 | Disposition: A | Discharge status: Home / Self Care | Payer: Medicare Other

## 2016-02-20 ENCOUNTER — Ambulatory Visit (HOSPITAL_COMMUNITY)
Admission: EM | Admit: 2016-02-20 | Discharge: 2016-02-20 | Disposition: A | Discharge status: Home / Self Care | Payer: Medicare Other | Attending: Physician Assistant | Admitting: Physician Assistant

## 2016-02-20 ENCOUNTER — Encounter (HOSPITAL_COMMUNITY): Payer: Self-pay | Admitting: *Deleted

## 2016-02-20 DIAGNOSIS — T148 Other injury of unspecified body region: Secondary | ICD-10-CM

## 2016-02-20 DIAGNOSIS — W57XXXA Bitten or stung by nonvenomous insect and other nonvenomous arthropods, initial encounter: Secondary | ICD-10-CM

## 2016-02-20 MED ORDER — CEPHALEXIN 500 MG PO CAPS: 500.0000 mg | ORAL_CAPSULE | Freq: Four times a day (QID) | ORAL | Status: DC

## 2016-02-20 NOTE — Discharge Instructions (Signed)
Tick Bite Information Ticks are insects that attach themselves to the skin. There are many types of ticks. Common types include wood ticks and deer ticks. Sometimes, ticks carry diseases that can make a person very ill. The most common places for ticks to attach themselves are the scalp, neck, armpits, waist, and groin.  HOW CAN YOU PREVENT TICK BITES? Take these steps to help prevent tick bites when you are outdoors:  Wear long sleeves and long pants.  Wear white clothes so you can see ticks more easily.  Tuck your pant legs into your socks.  If walking on a trail, stay in the middle of the trail to avoid brushing against bushes.  Avoid walking through areas with long grass.  Put bug spray on all skin that is showing and along boot tops, pant legs, and sleeve cuffs.  Check clothes, hair, and skin often and before going inside.  Brush off any ticks that are not attached.  Take a shower or bath as soon as possible after being outdoors. HOW SHOULD YOU REMOVE A TICK? Ticks should be removed as soon as possible to help prevent diseases.  If latex gloves are available, put them on before trying to remove a tick.  Use tweezers to grasp the tick as close to the skin as possible. You may also use curved forceps or a tick removal tool. Grasp the tick as close to its head as possible. Avoid grasping the tick on its body.  Pull gently upward until the tick lets go. Do not twist the tick or jerk it suddenly. This may break off the tick's head or mouth parts.  Do not squeeze or crush the tick's body. This could force disease-carrying fluids from the tick into your body.  After the tick is removed, wash the bite area and your hands with soap and water or alcohol.  Apply a small amount of antiseptic cream or ointment to the bite site.  Wash any tools that were used. Do not try to remove a tick by applying a hot match, petroleum jelly, or fingernail polish to the tick. These methods do not  work. They may also increase the chances of disease being spread from the tick bite. WHEN SHOULD YOU SEEK HELP? Contact your health care provider if you are unable to remove a tick or if a part of the tick breaks off in the skin. After a tick bite, you need to watch for signs and symptoms of diseases that can be spread by ticks. Contact your health care provider if you develop any of the following:  Fever.  Rash.  Redness and puffiness (swelling) in the area of the tick bite.  Tender, puffy lymph glands.  Watery poop (diarrhea).  Weight loss.  Cough.  Feeling more tired than normal (fatigue).  Muscle, joint, or bone pain.  Belly (abdominal) pain.  Headache.  Change in your level of consciousness.  Trouble walking or moving your legs.  Loss of feeling (numbness) in the legs.  Loss of movement (paralysis).  Shortness of breath.  Confusion.  Throwing up (vomiting) many times.   This information is not intended to replace advice given to you by your health care provider. Make sure you discuss any questions you have with your health care provider.   Document Released: 12/20/2009 Document Revised: 05/28/2013 Document Reviewed: 03/05/2013 Elsevier Interactive Patient Education 2016 Elsevier Inc. Cellulitis Cellulitis is an infection of the skin and the tissue beneath it. The infected area is usually red and tender.  Cellulitis occurs most often in the arms and lower legs.  CAUSES  Cellulitis is caused by bacteria that enter the skin through cracks or cuts in the skin. The most common types of bacteria that cause cellulitis are staphylococci and streptococci. SIGNS AND SYMPTOMS   Redness and warmth.  Swelling.  Tenderness or pain.  Fever. DIAGNOSIS  Your health care provider can usually determine what is wrong based on a physical exam. Blood tests may also be done. TREATMENT  Treatment usually involves taking an antibiotic medicine. HOME CARE INSTRUCTIONS    Take your antibiotic medicine as directed by your health care provider. Finish the antibiotic even if you start to feel better.  Keep the infected arm or leg elevated to reduce swelling.  Apply a warm cloth to the affected area up to 4 times per day to relieve pain.  Take medicines only as directed by your health care provider.  Keep all follow-up visits as directed by your health care provider. SEEK MEDICAL CARE IF:   You notice red streaks coming from the infected area.  Your red area gets larger or turns dark in color.  Your bone or joint underneath the infected area becomes painful after the skin has healed.  Your infection returns in the same area or another area.  You notice a swollen bump in the infected area.  You develop new symptoms.  You have a fever. SEEK IMMEDIATE MEDICAL CARE IF:   You feel very sleepy.  You develop vomiting or diarrhea.  You have a general ill feeling (malaise) with muscle aches and pains.   This information is not intended to replace advice given to you by your health care provider. Make sure you discuss any questions you have with your health care provider.   Document Released: 07/05/2005 Document Revised: 06/16/2015 Document Reviewed: 12/11/2011 Elsevier Interactive Patient Education [employer] Elsevier Inc.

## 2016-02-20 NOTE — ED Notes (Signed)
Patient reports noticing papule to left inner thigh, red itching swollen and warm to the touch.

## 2016-05-23 DIAGNOSIS — F5101 Primary insomnia: Secondary | ICD-10-CM | POA: Insufficient documentation

## 2016-08-03 DIAGNOSIS — M25562 Pain in left knee: Secondary | ICD-10-CM | POA: Insufficient documentation

## 2016-09-25 DIAGNOSIS — Z9889 Other specified postprocedural states: Secondary | ICD-10-CM | POA: Insufficient documentation

## 2017-06-11 DIAGNOSIS — E039 Hypothyroidism, unspecified: Secondary | ICD-10-CM | POA: Insufficient documentation

## 2017-06-11 DIAGNOSIS — R7301 Impaired fasting glucose: Secondary | ICD-10-CM | POA: Insufficient documentation

## 2017-06-11 DIAGNOSIS — Z0000 Encounter for general adult medical examination without abnormal findings: Secondary | ICD-10-CM | POA: Insufficient documentation

## 2017-06-11 DIAGNOSIS — R7303 Prediabetes: Secondary | ICD-10-CM | POA: Insufficient documentation

## 2017-06-11 DIAGNOSIS — Z23 Encounter for immunization: Secondary | ICD-10-CM | POA: Insufficient documentation

## 2018-11-29 DIAGNOSIS — J324 Chronic pansinusitis: Secondary | ICD-10-CM | POA: Insufficient documentation

## 2019-02-07 ENCOUNTER — Other Ambulatory Visit: Payer: Self-pay | Admitting: Otolaryngology

## 2019-02-10 ENCOUNTER — Encounter (HOSPITAL_COMMUNITY): Payer: Self-pay

## 2019-02-10 NOTE — Progress Notes (Signed)
[facility] [address] [place] - [address] [place] [address] Phone: [phone] Fax: [phone]      Your procedure is scheduled on Friday, 02/14/2019.  Report to [facility] Main Entrance "A" Then check in at Admitting at 0530 A.M.  Call this number if you have problems the morning of surgery:  [phone]  Call [phone] if you have any questions prior to your surgery date Monday-Friday 8am-4pm    Remember:  Do not eat or drink after midnight.    Take these medicines the morning of surgery with A SIP OF WATER: Levothyroxine (Synthroid)   7 days prior to surgery STOP taking any Aspirin (unless otherwise instructed by your surgeon), Aleve, Naproxen, Ibuprofen, Motrin, Advil, Goody's, BC's, all herbal medications, fish oil, and all vitamins.    The Morning of Surgery  Do not wear jewelry, make-up or nail polish.  Do not wear lotions, powders, or perfumes/colognes, or deodorant  Do not shave 48 hours prior to surgery.   Do not bring valuables to the hospital.  [facility] is not responsible for any belongings or valuables.  If you are a smoker, DO NOT Smoke 24 hours prior to surgery  REMEMBER THAT YOU MUST SOMEONE TO TRANSPORT YOU HOME AFTER SURGERY IF YOU ARE DISCHARGED THE SAME DAY OF SURGERY  YOU WILL ALSO NEED SOMEONE TO STAY WITH YOU FOR 24 HOURS AFTER SURGERY IF YOU ARE DISCHARGED THE SAME DAY OF SURGERY   Contacts, glasses, hearing aids, dentures or bridgework may not be worn into surgery.   If you wear a CPAP at night please bring your mask, tubing, and machine the morning of surgery   Leave your suitcase in the car.  After surgery it may be brought to your room.  For patients admitted to the hospital, discharge time will be determined by your treatment team.  Patients discharged the day of surgery will not be allowed to drive home.    Special instructions:   [place]- Preparing For Surgery  Before surgery,  you can play an important role. Because skin is not sterile, your skin needs to be as free of germs as possible. You can reduce the number of germs on your skin by washing with CHG (chlorahexidine gluconate) Soap before surgery.  CHG is an antiseptic cleaner which kills germs and bonds with the skin to continue killing germs even after washing.    Oral Hygiene is also important to reduce your risk of infection.  Remember - BRUSH YOUR TEETH THE MORNING OF SURGERY WITH YOUR REGULAR TOOTHPASTE  Please do not use if you have an allergy to CHG or antibacterial soaps. If your skin becomes reddened/irritated stop using the CHG.  Do not shave (including legs and underarms) for at least 48 hours prior to first CHG shower. It is OK to shave your face.  Please follow these instructions carefully.   1. Shower the NIGHT BEFORE SURGERY and the MORNING OF SURGERY with CHG Soap.   2. If you chose to wash your hair, wash your hair first as usual with your normal shampoo.  3. After you shampoo, rinse your hair and body thoroughly to remove the shampoo.  4. Use CHG as you would any other liquid soap. You can apply CHG directly to the skin and wash gently with a scrungie or a clean washcloth.   5. Apply the CHG Soap to your body ONLY FROM THE NECK DOWN.  Do not use on open  wounds or open sores. Avoid contact with your eyes, ears, mouth and genitals (private parts). Wash Face and genitals (private parts)  with your normal soap.   6. Wash thoroughly, paying special attention to the area where your surgery will be performed.  7. Thoroughly rinse your body with warm water from the neck down.  8. DO NOT shower/wash with your normal soap after using and rinsing off the CHG Soap.  9. Pat yourself dry with a CLEAN TOWEL.  10. Wear CLEAN PAJAMAS to bed the night before surgery, wear comfortable clothes the morning of surgery  11. Place CLEAN SHEETS on your bed the night of your first shower and DO NOT SLEEP WITH  PETS.    Day of Surgery:  Do not apply any deodorants/lotions.  Please wear clean clothes to the hospital/surgery center.   Remember to brush your teeth WITH YOUR REGULAR TOOTHPASTE.   Please read over the following fact sheets that you were given.

## 2019-02-11 ENCOUNTER — Encounter (HOSPITAL_COMMUNITY)
Admission: RE | Admit: 2019-02-11 | Discharge: 2019-02-11 | Disposition: A | Discharge status: Home / Self Care | Payer: Medicare Other | Source: Ambulatory Visit | Attending: Otolaryngology | Admitting: Otolaryngology

## 2019-02-11 ENCOUNTER — Encounter (HOSPITAL_COMMUNITY): Payer: Self-pay

## 2019-02-11 ENCOUNTER — Other Ambulatory Visit: Payer: Self-pay

## 2019-02-11 DIAGNOSIS — J329 Chronic sinusitis, unspecified: Secondary | ICD-10-CM | POA: Diagnosis present

## 2019-02-11 DIAGNOSIS — E039 Hypothyroidism, unspecified: Secondary | ICD-10-CM | POA: Diagnosis not present

## 2019-02-11 DIAGNOSIS — Z01818 Encounter for other preprocedural examination: Principal | ICD-10-CM

## 2019-02-11 DIAGNOSIS — Z79890 Hormone replacement therapy (postmenopausal): Secondary | ICD-10-CM | POA: Diagnosis not present

## 2019-02-11 HISTORY — DX: Nausea with vomiting, unspecified: R11.2

## 2019-02-11 HISTORY — DX: Other specified postprocedural states: Z98.890

## 2019-02-11 LAB — CBC
HCT: 42.8 % (ref 36.0–46.0)
Hemoglobin: 14.2 g/dL (ref 12.0–15.0)
MCH: 30.4 pg (ref 26.0–34.0)
MCHC: 33.2 g/dL (ref 30.0–36.0)
MCV: 91.6 fL (ref 80.0–100.0)
Platelets: 331 10*3/uL (ref 150–400)
RBC: 4.67 MIL/uL (ref 3.87–5.11)
RDW: 12.5 % (ref 11.5–15.5)
WBC: 5.9 10*3/uL (ref 4.0–10.5)
nRBC: 0 % (ref 0.0–0.2)

## 2019-02-11 NOTE — Progress Notes (Signed)
PCP - Dr. Irena Reichmann Cardiologist - Dr. Allyson Sabal  Chest x-ray - N/A EKG - 02/11/19 Stress Test - denies ECHO - denies Cardiac Cath - denies  Sleep Study - 2019- does not use CPAP  Aspirin Instructions: Patient instructed to hold all Aspirin, NSAID's, herbal medications, fish oil and vitamins 7 days prior to surgery.   Anesthesia review: review EKG  Patient denies shortness of breath, fever, cough and chest pain at PAT appointment   Patient aware following surgery he will need to be in the care of another adult for 24 hours, cannot drive, operate heavy machinery, drink ETOH, or make any legal decisions.  Patient verbalized understanding of instructions that were given to them at the PAT appointment. Patient was also instructed that they will need to review over the PAT instructions again at home before surgery.

## 2019-02-14 ENCOUNTER — Encounter (HOSPITAL_COMMUNITY)
Admission: RE | Disposition: A | Discharge status: Home / Self Care | Payer: Self-pay | Source: Home / Self Care | Attending: Otolaryngology

## 2019-02-14 ENCOUNTER — Ambulatory Visit (HOSPITAL_COMMUNITY): Payer: Medicare Other | Admitting: Registered Nurse

## 2019-02-14 ENCOUNTER — Encounter (HOSPITAL_COMMUNITY): Payer: Self-pay

## 2019-02-14 ENCOUNTER — Ambulatory Visit (HOSPITAL_COMMUNITY)
Admission: RE | Admit: 2019-02-14 | Discharge: 2019-02-14 | Disposition: A | Discharge status: Home / Self Care | Payer: Medicare Other | Attending: Otolaryngology | Admitting: Otolaryngology

## 2019-02-14 ENCOUNTER — Other Ambulatory Visit: Payer: Self-pay

## 2019-02-14 ENCOUNTER — Ambulatory Visit (HOSPITAL_COMMUNITY): Payer: Medicare Other | Admitting: Vascular Surgery

## 2019-02-14 DIAGNOSIS — Z79890 Hormone replacement therapy (postmenopausal): Secondary | ICD-10-CM | POA: Insufficient documentation

## 2019-02-14 DIAGNOSIS — E039 Hypothyroidism, unspecified: Secondary | ICD-10-CM | POA: Diagnosis not present

## 2019-02-14 DIAGNOSIS — J329 Chronic sinusitis, unspecified: Secondary | ICD-10-CM | POA: Diagnosis not present

## 2019-02-14 HISTORY — PX: SINUS ENDO WITH FUSION: SHX5329

## 2019-02-14 SURGERY — SURGERY, PARANASAL SINUS, ENDOSCOPIC, WITH NASAL SEPTOPLASTY, TURBINOPLASTY, AND MAXILLARY SINUSOTOMY
Anesthesia: General | Site: Nose | Laterality: Bilateral

## 2019-02-14 MED ORDER — HYDROMORPHONE HCL 1 MG/ML IJ SOLN
INTRAMUSCULAR | Status: AC
  Filled 2019-02-14: qty 1

## 2019-02-14 MED ORDER — ONDANSETRON HCL 4 MG/2ML IJ SOLN
INTRAMUSCULAR | Status: AC
  Filled 2019-02-14: qty 2

## 2019-02-14 MED ORDER — EPHEDRINE SULFATE-NACL 50-0.9 MG/10ML-% IV SOSY
PREFILLED_SYRINGE | INTRAVENOUS | Status: DC | PRN
  Administered 2019-02-14: 5 mg via INTRAVENOUS

## 2019-02-14 MED ORDER — PHENYLEPHRINE 40 MCG/ML (10ML) SYRINGE FOR IV PUSH (FOR BLOOD PRESSURE SUPPORT)
PREFILLED_SYRINGE | INTRAVENOUS | Status: AC
  Filled 2019-02-14: qty 10

## 2019-02-14 MED ORDER — HYDROMORPHONE HCL 1 MG/ML IJ SOLN
0.2500 mg | INTRAMUSCULAR | Status: DC | PRN
  Administered 2019-02-14: 0.25 mg via INTRAVENOUS

## 2019-02-14 MED ORDER — EPHEDRINE 5 MG/ML INJ
INTRAVENOUS | Status: AC
  Filled 2019-02-14: qty 10

## 2019-02-14 MED ORDER — FENTANYL CITRATE (PF) 250 MCG/5ML IJ SOLN
INTRAMUSCULAR | Status: AC
  Filled 2019-02-14: qty 5

## 2019-02-14 MED ORDER — OXYMETAZOLINE HCL 0.05 % NA SOLN
NASAL | Status: DC | PRN
  Administered 2019-02-14: 1

## 2019-02-14 MED ORDER — HYDROCODONE-ACETAMINOPHEN 5-325 MG PO TABS: 1.0000 | ORAL_TABLET | Freq: Four times a day (QID) | ORAL | 0 refills | Status: DC | PRN

## 2019-02-14 MED ORDER — LIDOCAINE-EPINEPHRINE 1 %-1:100000 IJ SOLN
INTRAMUSCULAR | Status: DC | PRN
  Administered 2019-02-14: 20 mL

## 2019-02-14 MED ORDER — SODIUM CHLORIDE 0.9 % IV SOLN
INTRAVENOUS | Status: DC | PRN
  Administered 2019-02-14: 08:00:00 30 ug/min via INTRAVENOUS

## 2019-02-14 MED ORDER — SUCCINYLCHOLINE CHLORIDE 20 MG/ML IJ SOLN
INTRAMUSCULAR | Status: DC | PRN
  Administered 2019-02-14: 120 mg via INTRAVENOUS

## 2019-02-14 MED ORDER — LIDOCAINE 2% (20 MG/ML) 5 ML SYRINGE
INTRAMUSCULAR | Status: AC
  Filled 2019-02-14: qty 5

## 2019-02-14 MED ORDER — SUCCINYLCHOLINE CHLORIDE 200 MG/10ML IV SOSY
PREFILLED_SYRINGE | INTRAVENOUS | Status: AC
  Filled 2019-02-14: qty 10

## 2019-02-14 MED ORDER — ARTIFICIAL TEARS OPHTHALMIC OINT
TOPICAL_OINTMENT | OPHTHALMIC | Status: DC | PRN
  Administered 2019-02-14: 1 via OPHTHALMIC

## 2019-02-14 MED ORDER — PROMETHAZINE HCL 25 MG/ML IJ SOLN: 6.2500 mg | INTRAMUSCULAR | Status: DC | PRN

## 2019-02-14 MED ORDER — BACITRACIN ZINC 500 UNIT/GM EX OINT
TOPICAL_OINTMENT | CUTANEOUS | Status: AC
  Filled 2019-02-14: qty 28.35

## 2019-02-14 MED ORDER — BACITRACIN ZINC 500 UNIT/GM EX OINT
TOPICAL_OINTMENT | CUTANEOUS | Status: DC | PRN
  Administered 2019-02-14: 1 via TOPICAL

## 2019-02-14 MED ORDER — PHENYLEPHRINE 40 MCG/ML (10ML) SYRINGE FOR IV PUSH (FOR BLOOD PRESSURE SUPPORT)
PREFILLED_SYRINGE | INTRAVENOUS | Status: DC | PRN
  Administered 2019-02-14 (×2): 80 ug via INTRAVENOUS
  Administered 2019-02-14: 120 ug via INTRAVENOUS
  Administered 2019-02-14: 80 ug via INTRAVENOUS

## 2019-02-14 MED ORDER — OXYCODONE HCL 5 MG/5ML PO SOLN: 5.0000 mg | Freq: Once | ORAL | Status: AC | PRN

## 2019-02-14 MED ORDER — LIDOCAINE-EPINEPHRINE 1 %-1:100000 IJ SOLN
INTRAMUSCULAR | Status: AC
  Filled 2019-02-14: qty 1

## 2019-02-14 MED ORDER — LACTATED RINGERS IV SOLN
INTRAVENOUS | Status: DC | PRN
  Administered 2019-02-14 (×2): via INTRAVENOUS

## 2019-02-14 MED ORDER — DEXAMETHASONE SODIUM PHOSPHATE 10 MG/ML IJ SOLN
INTRAMUSCULAR | Status: AC
  Filled 2019-02-14: qty 1

## 2019-02-14 MED ORDER — DEXAMETHASONE SODIUM PHOSPHATE 10 MG/ML IJ SOLN
INTRAMUSCULAR | Status: DC | PRN
  Administered 2019-02-14: 5 mg via INTRAVENOUS

## 2019-02-14 MED ORDER — OXYMETAZOLINE HCL 0.05 % NA SOLN
NASAL | Status: AC
  Filled 2019-02-14: qty 30

## 2019-02-14 MED ORDER — ONDANSETRON HCL 4 MG/2ML IJ SOLN
INTRAMUSCULAR | Status: DC | PRN
  Administered 2019-02-14: 4 mg via INTRAVENOUS

## 2019-02-14 MED ORDER — PROPOFOL 10 MG/ML IV BOLUS
INTRAVENOUS | Status: AC
  Filled 2019-02-14: qty 20

## 2019-02-14 MED ORDER — OXYCODONE HCL 5 MG PO TABS
ORAL_TABLET | ORAL | Status: AC
  Filled 2019-02-14: qty 1

## 2019-02-14 MED ORDER — PROPOFOL 10 MG/ML IV BOLUS
INTRAVENOUS | Status: DC | PRN
  Administered 2019-02-14: 120 mg via INTRAVENOUS

## 2019-02-14 MED ORDER — CEPHALEXIN 500 MG PO CAPS: 500.0000 mg | ORAL_CAPSULE | Freq: Three times a day (TID) | ORAL | 0 refills | Status: DC

## 2019-02-14 MED ORDER — LIDOCAINE 2% (20 MG/ML) 5 ML SYRINGE
INTRAMUSCULAR | Status: DC | PRN
  Administered 2019-02-14: 60 mg via INTRAVENOUS

## 2019-02-14 MED ORDER — FENTANYL CITRATE (PF) 100 MCG/2ML IJ SOLN
INTRAMUSCULAR | Status: DC | PRN
  Administered 2019-02-14: 100 ug via INTRAVENOUS

## 2019-02-14 MED ORDER — SODIUM CHLORIDE 0.9 % IR SOLN
Status: DC | PRN
  Administered 2019-02-14: 1000 mL

## 2019-02-14 MED ORDER — OXYCODONE HCL 5 MG PO TABS
5.0000 mg | ORAL_TABLET | Freq: Once | ORAL | Status: AC | PRN
  Administered 2019-02-14: 5 mg via ORAL

## 2019-02-14 MED ORDER — MEPERIDINE HCL 25 MG/ML IJ SOLN: 6.2500 mg | INTRAMUSCULAR | Status: DC | PRN

## 2019-02-14 SURGICAL SUPPLY — 48 items
BLADE RAD40 ROTATE 4M 4 5PK (BLADE) IMPLANT
BLADE RAD40 ROTATE 4M 4MM 5PK (BLADE)
BLADE RAD60 ROTATE M4 4 5PK (BLADE) IMPLANT
BLADE RAD60 ROTATE M4 4MM 5PK (BLADE)
BLADE ROTATE RAD 40 4 M4 (BLADE) ×2 IMPLANT
BLADE ROTATE RAD 40 4MM M4 (BLADE) ×1
BLADE ROTATE TRICUT 4MX13CM M4 (BLADE) ×1
BLADE ROTATE TRICUT 4X13 M4 (BLADE) ×2 IMPLANT
BLADE TRICUT ROTATE M4 4 5PK (BLADE) ×2 IMPLANT
BLADE TRICUT ROTATE M4 4MM 5PK (BLADE) ×1
CANISTER SUCT 3000ML PPV (MISCELLANEOUS) ×6 IMPLANT
COAGULATOR SUCT 6 FR SWTCH (ELECTROSURGICAL)
COAGULATOR SUCT SWTCH 10FR 6 (ELECTROSURGICAL) IMPLANT
COVER WAND RF STERILE (DRAPES) ×3 IMPLANT
CRADLE DONUT ADULT HEAD (MISCELLANEOUS) ×2 IMPLANT
DRAPE HALF SHEET 40X57 (DRAPES) IMPLANT
DRESSING NASAL KENNEDY 3.5X.9 (MISCELLANEOUS) IMPLANT
DRESSING TELFA 8X10 (GAUZE/BANDAGES/DRESSINGS) IMPLANT
DRSG NASAL KENNEDY 3.5X.9 (MISCELLANEOUS)
DRSG NASOPORE 8CM (GAUZE/BANDAGES/DRESSINGS) ×2 IMPLANT
ELECT REM PT RETURN 9FT ADLT (ELECTROSURGICAL) ×3
ELECTRODE REM PT RTRN 9FT ADLT (ELECTROSURGICAL) IMPLANT
FILTER ARTHROSCOPY CONVERTOR (FILTER) IMPLANT
GLOVE BIO SURGEON STRL SZ7.5 (GLOVE) ×6 IMPLANT
GOWN STRL REUS W/ TWL LRG LVL3 (GOWN DISPOSABLE) ×2 IMPLANT
GOWN STRL REUS W/TWL LRG LVL3 (GOWN DISPOSABLE) ×6
KIT BASIN OR (CUSTOM PROCEDURE TRAY) ×3 IMPLANT
KIT TURNOVER KIT B (KITS) ×3 IMPLANT
NDL HYPO 25GX1X1/2 BEV (NEEDLE) IMPLANT
NEEDLE HYPO 25GX1X1/2 BEV (NEEDLE) IMPLANT
NS IRRIG 1000ML POUR BTL (IV SOLUTION) ×3 IMPLANT
PAD ARMBOARD 7.5X6 YLW CONV (MISCELLANEOUS) ×6 IMPLANT
PATTIES SURGICAL .5 X3 (DISPOSABLE) ×3 IMPLANT
SOLUTION ANTI FOG 6CC (MISCELLANEOUS) ×3 IMPLANT
SPECIMEN JAR SMALL (MISCELLANEOUS) ×6 IMPLANT
SUT CHROMIC 4 0 P 3 18 (SUTURE) IMPLANT
SUT ETHILON 3 0 PS 1 (SUTURE) IMPLANT
SUT PLAIN 4 0 ~~LOC~~ 1 (SUTURE) ×3 IMPLANT
SWAB COLLECTION DEVICE MRSA (MISCELLANEOUS) IMPLANT
SWAB CULTURE ESWAB REG 1ML (MISCELLANEOUS) IMPLANT
TOWEL NATURAL 6PK STERILE (DISPOSABLE) ×3 IMPLANT
TRACKER ENT INSTRUMENT (MISCELLANEOUS) ×3 IMPLANT
TRACKER ENT PATIENT (MISCELLANEOUS) ×3 IMPLANT
TRAY ENT MC OR (CUSTOM PROCEDURE TRAY) ×3 IMPLANT
TUBE CONNECTING 12'X1/4 (SUCTIONS) ×1
TUBE CONNECTING 12X1/4 (SUCTIONS) ×2 IMPLANT
TUBING STRAIGHTSHOT EPS 5PK (TUBING) ×3 IMPLANT
WATER STERILE IRR 1000ML POUR (IV SOLUTION) ×3 IMPLANT

## 2019-02-14 NOTE — Anesthesia Procedure Notes (Signed)

## 2019-02-14 NOTE — Progress Notes (Signed)
Notified Pharmacy to update patient's pharmacy of choice to [facility] in [place], [place].

## 2019-02-14 NOTE — Anesthesia Postprocedure Evaluation (Signed)
Anesthesia Post Note  Patient: Julia French  Procedure(s) Performed: BILATERAL ENDOSCOPIC SINUS SURGERY WITH FUSION PROTOCOL (Bilateral Nose)     Patient location during evaluation: PACU Anesthesia Type: General Level of consciousness: awake and alert Pain management: pain level controlled Vital Signs Assessment: post-procedure vital signs reviewed and stable Respiratory status: spontaneous breathing, nonlabored ventilation and respiratory function stable Cardiovascular status: blood pressure returned to baseline and stable Postop Assessment: no apparent nausea or vomiting Anesthetic complications: no    Last Vitals:  Vitals:   02/14/19 0939 02/14/19 0941  BP: 138/79 (!) 152/78  Pulse: 72 73  Resp: (!) 9 17  Temp:  (!) 36.1 C  SpO2: 99% 97%    Last Pain:  Vitals:   02/14/19 0941  TempSrc:   PainSc: 2                  Lowella Curb

## 2019-02-14 NOTE — Op Note (Signed)
Preop/postop diagnosis: Chronic sinusitis Procedure: Bilateral maxillary antrostomy, bilateral total ethmoidectomy, bilateral nasal frontal expiration, and right sphenoidotomy.  Fusion computer guidance Anesthesia: General Estimated blood loss: Approximately 25 cc Indications: 68 year old with a persistent foul odor in her nose and sinusitis documented on CT scan.  She is ready to proceed with sinus surgery as medical therapy is failed.  She was informed the risk and benefits of the procedure and options were discussed all questions were answered and consent was obtained. Procedure: Patient was taken to the operating room placed in the supine position after general endotracheal tube anesthesia was placed in this supine position prepped and draped in usual sterile manner.  The fusion guidance system was positioned calibrated with excellent accuracy.  The inferior turbinate middle turbinate were injected with 1% lidocaine with 1 100,000 epinephrine.  The left side was begun trimming the lateral aspect of the middle turbinate as it was paradoxically position.  The uncinate process was removed from the attachment to the middle turbinate down to the antrostomy.  The antrostomy is open widely.  There was thick tissue throughout this area.  The ethmoid was then opened in the bulla and the dissection was carried from anterior to posterior dissecting the ethmoid opened using future fusion guidance.  The tissue was all thickened and polypoid.  Into the nasofrontal duct there was polypoid tissue but no purulence.  The nasofrontal duct was cannulated with a curved guided suction.  It was nicely open.  Pledget was placed.  Left side had good hemostasis.  Right side was then begun with the same technique.  Fusion guidance was used throughout.  The antrostomy ethmoid and nasofrontal duct were open.  This side on the CT had material in the sphenoid and it was open.  All the tissue was thickened and polypoid throughout the  dissection.  There was no purulence.  Pledget was placed.  There was good hemostasis bilaterally and nasal pore soaked in bacitracin was placed into the ethmoid bilaterally.  Nasopharynx was suctioned out blood and debris.  The patient was awakened brought to recovery in stable condition counts correct

## 2019-02-14 NOTE — Transfer of Care (Signed)
Immediate Anesthesia Transfer of Care Note  Patient: Julia French  Procedure(s) Performed: BILATERAL ENDOSCOPIC SINUS SURGERY WITH FUSION PROTOCOL (Bilateral Nose)  Patient Location: PACU  Anesthesia Type:General  Level of Consciousness: awake, alert  and oriented  Airway & Oxygen Therapy: Patient Spontanous Breathing and Patient connected to face mask oxygen  Post-op Assessment: Report given to RN and Post -op Vital signs reviewed and stable  Post vital signs: Reviewed and stable  Last Vitals:  Vitals Value Taken Time  BP 143/94 02/14/2019  8:56 AM  Temp    Pulse 81 02/14/2019  8:57 AM  Resp 17 02/14/2019  8:57 AM  SpO2 100 % 02/14/2019  8:57 AM  Vitals shown include unvalidated device data.  Last Pain:  Vitals:   02/14/19 0611  TempSrc: Oral  PainSc:       Patients Stated Pain Goal: 2 (02/14/19 0601)  Complications: No apparent anesthesia complications

## 2019-02-14 NOTE — H&P (Signed)
Julia French is an 68 y.o. female.   Chief Complaint: sinusitis HPI: hx of chronic sinusitis and rady for surgery  Past Medical History:  Diagnosis Date  . Anxiety   . Palpitations   . PONV (postoperative nausea and vomiting)   . Thyroid disease     Past Surgical History:  Procedure Laterality Date  . ABDOMINAL HYSTERECTOMY  1991  . BACK SURGERY    . BACK SURGERY  1988   slipped disc repair  . COLON SURGERY  05/2009  . KNEE SURGERY  2017  . NASAL SINUS SURGERY  11/2007  . THYROIDECTOMY  03/05/2012   Procedure: THYROIDECTOMY;  Surgeon: Velora Heckler, MD;  Location: [facility];  Service: General;  Laterality: N/A;  Total Thyroidectomy    Family History  Problem Relation Age of Onset  . Cancer Brother        Stomach   Social History:  reports that she has never smoked. She has never used smokeless tobacco. She reports current alcohol use. She reports that she does not use drugs.  Allergies:  Allergies  Allergen Reactions  . Lac Bovis Other (See Comments)    GI Upset LACTOSE INTOLERANCE  . Lactose Intolerance (Gi) Other (See Comments)    GI UPSET    Medications Prior to Admission  Medication Sig Dispense Refill  . levothyroxine (SYNTHROID) 100 MCG tablet Take 100 mcg by mouth daily before breakfast.    . traZODone (DESYREL) 100 MG tablet Take 100 mg by mouth at bedtime as needed for sleep.      No results found for this or any previous visit (from the past 48 hour(s)). No results found.  Review of Systems  Constitutional: Negative.   HENT: Negative.   Eyes: Negative.   Respiratory: Negative.   Cardiovascular: Negative.   Skin: Negative.     Blood pressure (!) 142/71, pulse (!) 58, temperature 97.9 F (36.6 C), temperature source Oral, resp. rate 18, height 5' 2.5" (1.588 m), weight 67.1 kg, SpO2 99 %. Physical Exam  Constitutional: She appears well-developed.  HENT:  Head: Normocephalic and atraumatic.  Mouth/Throat: Oropharynx is clear and moist.  Eyes:  Pupils are equal, round, and reactive to light. Conjunctivae are normal.  Neck: Normal range of motion. Neck supple.  Cardiovascular: Normal rate.  Respiratory: Effort normal.  GI: Soft.  Musculoskeletal: Normal range of motion.     Assessment/Plan Chronic sinusitis- discussed ESS and ready to proceed  Suzanna Obey, MD 02/14/2019, 7:22 AM

## 2019-02-14 NOTE — Anesthesia Preprocedure Evaluation (Signed)
Anesthesia Evaluation  Patient identified by MRN, date of birth, ID band Patient awake    Reviewed: Allergy & Precautions, H&P , NPO status , Patient's Chart, lab work & pertinent test results  History of Anesthesia Complications (+) PONVNegative for: history of anesthetic complications  Airway Mallampati: II  TM Distance: >3 FB Neck ROM: Full    Dental  (+) Teeth Intact, Dental Advisory Given   Pulmonary neg pulmonary ROS,    Pulmonary exam normal breath sounds clear to auscultation       Cardiovascular negative cardio ROS Normal cardiovascular exam Rhythm:Regular Rate:Normal     Neuro/Psych Anxiety negative neurological ROS     GI/Hepatic negative GI ROS, Neg liver ROS,   Endo/Other  Hypothyroidism   Renal/GU negative Renal ROS  negative genitourinary   Musculoskeletal negative musculoskeletal ROS (+)   Abdominal   Peds  Hematology negative hematology ROS (+)   Anesthesia Other Findings   Reproductive/Obstetrics negative OB ROS                             Anesthesia Physical  Anesthesia Plan  ASA: II  Anesthesia Plan: General   Post-op Pain Management:    Induction: Intravenous  PONV Risk Score and Plan: 4 or greater and Ondansetron, Dexamethasone, Midazolam, Scopolamine patch - Pre-op and Treatment may vary due to age or medical condition  Airway Management Planned: Oral ETT  Additional Equipment:   Intra-op Plan:   Post-operative Plan: Extubation in OR  Informed Consent: I have reviewed the patients History and Physical, chart, labs and discussed the procedure including the risks, benefits and alternatives for the proposed anesthesia with the patient or authorized representative who has indicated his/her understanding and acceptance.     Dental advisory given  Plan Discussed with: CRNA  Anesthesia Plan Comments:         Anesthesia Quick Evaluation

## 2019-02-15 ENCOUNTER — Encounter (HOSPITAL_COMMUNITY): Payer: Self-pay | Admitting: Otolaryngology

## 2020-12-16 DIAGNOSIS — Z90710 Acquired absence of both cervix and uterus: Secondary | ICD-10-CM | POA: Insufficient documentation

## 2021-03-08 ENCOUNTER — Ambulatory Visit (INDEPENDENT_AMBULATORY_CARE_PROVIDER_SITE_OTHER): Payer: Medicare Other

## 2021-03-08 ENCOUNTER — Other Ambulatory Visit: Payer: Self-pay

## 2021-03-08 ENCOUNTER — Ambulatory Visit (INDEPENDENT_AMBULATORY_CARE_PROVIDER_SITE_OTHER): Payer: Medicare Other | Admitting: Sports Medicine

## 2021-03-08 ENCOUNTER — Other Ambulatory Visit: Payer: Self-pay | Admitting: Sports Medicine

## 2021-03-08 ENCOUNTER — Encounter: Payer: Self-pay | Admitting: Sports Medicine

## 2021-03-08 DIAGNOSIS — M21611 Bunion of right foot: Secondary | ICD-10-CM

## 2021-03-08 DIAGNOSIS — M2021 Hallux rigidus, right foot: Secondary | ICD-10-CM

## 2021-03-08 DIAGNOSIS — M21612 Bunion of left foot: Secondary | ICD-10-CM | POA: Diagnosis not present

## 2021-03-08 DIAGNOSIS — M2022 Hallux rigidus, left foot: Secondary | ICD-10-CM

## 2021-03-08 DIAGNOSIS — M79672 Pain in left foot: Secondary | ICD-10-CM

## 2021-03-08 DIAGNOSIS — M79671 Pain in right foot: Secondary | ICD-10-CM

## 2021-03-08 NOTE — Patient Instructions (Addendum)
Topical Voltaren over the counter to use on the big toe joints   Shoe list For tennis shoes recommend:  Anne Shutter Ascis New balance Saucony Can be purchased at [employer] or [employer] arch fit Can be purchased at any major retailers  Vionic  SAS Can be purchased at [employer] or [employer]   For work shoes recommend: Pharmacologist [employer] Can be purchased at a variety of places or Scientist, product/process development   For casual shoes recommend: Vionic  Can be purchased at [employer] or [employer]   For Over the [employer] recommend: Power Steps Can be purchased in office/[facility] [employer] Can be purchased at [employer] or [employer]   Hallux Rigidus  Hallux rigidus is a type of joint pain or joint disease (arthritis) that affects your big toe (hallux). This condition involves the joint that connects the base of your big toe to the main part of your foot (metatarsophalangeal joint or MTP joint). This condition can cause your big toe to become stiff, painful, and difficult to move. Symptoms may get worse with movement or in cold or damp weather. The condition gets worse over time. What are the causes? This condition may be caused by having a foot that does not function the way that it should or that has an abnormal shape (structural deformity). These foot problems can run in families and may be passed down from parents to children (are hereditary). This condition can also be caused by:  Injury.  Overuse.  Certain inflammatory diseases, including gout and rheumatoid arthritis. What increases the risk? You are more likely to develop this condition if you have:  A foot bone (metatarsal) that is longer or higher than normal.  A family history of hallux rigidus.  Previously injured your big toe.  Feet that do not have a curve (arch) on the inner side of the foot. This may be called flat feet or fallen arches.  Ankles that turn  in when you walk (pronation).  Rheumatoid arthritis or gout.  A job that requires you to stoop down often at work. What are the signs or symptoms? Symptoms of this condition include:  Big toe pain.  Stiffness and difficulty moving the big toe.  Swelling of the toe and surrounding area.  Bone spurs. These are bony growths that can form on the joint of the big toe.  A limp. How is this diagnosed? This condition is diagnosed based on your medical history and a physical exam. You may also have X-rays. How is this treated? This condition is treated by:  Wearing roomy, comfortable shoes that have a large toe box.  Putting orthotic devices in your shoes.  Taking pain medicines.  Having physical therapy.  Icing the injured area.  Alternating between putting your foot in cold water and then in warm water. If your condition is severe, treatment may include:  Corticosteroid injections to relieve pain.  Surgery to remove bone spurs, fuse damaged bones together, or replace the entire joint. Follow these instructions at home: Managing pain, stiffness, and swelling  Put your feet in cold water for 30 seconds, and then in warm water for 30 seconds. Alternate between the cold and warm water for 5 minutes. Do this several times a day or as told by your health care provider.  If directed, put ice on the injured area. ? Put ice in a plastic bag. ? Place a towel between your skin and the bag. ?  Leave the ice on for 20 minutes, 2-3 times a day.   General instructions  Take over-the-counter and prescription medicines only as told by your health care provider.  Do not wear high heels or other restrictive footwear. Wear comfortable, supportive shoes that have a large toe box.  Wear shoe inserts (orthotics) as told by your health care provider, if this applies.  Do foot exercises as instructed by your health care provider or a physical therapist.  Keep all follow-up visits as told by  your health care provider. This is important. Contact a health care provider if:  You notice bone spurs or growths on or around your big toe.  Your pain does not get better or it gets worse.  You have pain while resting.  You have pain in other parts of your body, such as your back, hip, or knee.  You start to limp. Summary  Hallux rigidus is a condition that makes your big toe become stiff, painful, and difficult to move.  It can be caused by injury, overuse, or inflammatory diseases.  This condition may be treated with ice, medicines, physical therapy, and surgery.  Do not wear high heels or other restrictive footwear. Wear comfortable, supportive shoes that have a large toe box. This information is not intended to replace advice given to you by your health care provider. Make sure you discuss any questions you have with your health care provider. Document Revised: 07/05/2018 Document Reviewed: 07/08/2018 Elsevier Patient Education  2021 [employer].  Can be purchased at [employer]

## 2021-03-08 NOTE — Progress Notes (Signed)
Subjective: Julia French is a 70 y.o. female patient who presents to office for evaluation of left over right pain at the big toe joints.  Patient reports that the pain is sharp in nature 7 out of 10 with some occasional swelling and worse with direct pressure from shoes has not tried any treatments.  Denies any other pedal complaints at this time.  Patient is assisted by husband this visit.   Patient Active Problem List   Diagnosis Date Noted  . History of laparoscopic-assisted vaginal hysterectomy 12/16/2020  . Chronic pansinusitis 11/29/2018  . Elevated fasting glucose 06/11/2017  . Hypothyroidism 06/11/2017  . Medicare annual wellness visit, subsequent 06/11/2017  . Need for pneumococcal vaccination 06/11/2017  . Prediabetes 06/11/2017  . S/P left knee arthroscopy 09/25/2016  . Acute pain of left knee 08/03/2016  . Primary insomnia 05/23/2016  . Pain in surgical scar 07/08/2013  . [place] 08/05/2012  . 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 levothyroxine (SYNTHROID) 100 MCG tablet Take 100 mcg by mouth daily before breakfast.     No current facility-administered medications on file prior to visit.    Allergies  Allergen Reactions  . Lac Bovis Other (See Comments)    GI Upset LACTOSE INTOLERANCE  . Lactose Intolerance (Gi) Other (See Comments)    GI UPSET    Objective:  General: Alert and oriented x3 in no acute distress  Dermatology: Pinch callus medial hallux bilateral.  No open lesions bilateral lower extremities, no webspace macerations, no ecchymosis bilateral, all nails x 10 are well manicured.  Vascular: Dorsalis Pedis and Posterior Tibial pedal pulses 1/4, Capillary Fill Time 3 seconds, (+) pedal hair growth bilateral, no edema bilateral lower extremities, Temperature gradient within normal limits.  Neurology: Michaell Cowing sensation intact via light touch bilateral.    Musculoskeletal: Mild tenderness with palpation left greater than right dorsal  bunion deformity, there is severely limited range of motion at the first MPJs bilateral left only gets 10 degrees of dorsi flexion and 0 degrees of plantarflexion and right gets 15 degrees of dorsiflexion and 0 degrees of plantarflexion consistent with hallux rigidus.  There is mild hallux interphalangeal joint adaptation from compensation for rigidus deformity.   Xrays  Right/Left Foot    Impression: Joint spaces are severely narrowed with dorsal bone spurs noted consistent with arthritis there is IPJ joint deviation with likely adaptation/secondary to compensation from limited first MPJ range of motion      Assessment and Plan: Problem List Items Addressed This Visit   None   Visit Diagnoses    Bilateral bunions    -  Primary   Relevant Orders   DG Foot Complete Right   DG Foot Complete Left   Hallux rigidus of both feet       Bilateral foot pain       L>R       -Complete examination performed -Xrays reviewed -Discussed treatement options; discussed Hallux rigidus and bilateral foot pain -Offered patient cushioning for the first MPJs however she declined at this time and states that she will use her bunion pads that she has that are very similar -Recommend over-the-counter anti-inflammatories and topical Voltaren -Recommend continue with good supportive shoes and over-the-counter inserts.  -Advised patient if symptoms fail to improve may benefit from surgery likely a great candidate for a Silastic great toe implant -Patient to return to office as needed or sooner if condition worsens.  Landis Martins, DPM

## 2021-09-26 ENCOUNTER — Telehealth: Payer: Self-pay | Admitting: Sports Medicine

## 2021-09-26 NOTE — Telephone Encounter (Signed)
Patient was just seen in office and would like to have copy of CD to take for a 2nd opinion. Patient has a appointment tomorrow 09/27/2021, so would need to pick up CD in the morning.

## 2021-09-26 NOTE — Telephone Encounter (Signed)
Spoke with pt on Fri to advise she can pick up records & pictures of xray.  Pt aware she needs to sign a release form.

## 2022-12-21 ENCOUNTER — Encounter: Payer: Self-pay | Admitting: Gastroenterology
